# Patient Record
Sex: Male | Born: 1970
Health system: Southern US, Community
[De-identification: ages and names within clinical notes are randomized; demographics above are authoritative.]

## PROBLEM LIST (undated history)

## (undated) DIAGNOSIS — Z9119 Patient's noncompliance with other medical treatment and regimen: Secondary | ICD-10-CM

## (undated) DIAGNOSIS — N289 Disorder of kidney and ureter, unspecified: Secondary | ICD-10-CM

## (undated) DIAGNOSIS — I10 Essential (primary) hypertension: Secondary | ICD-10-CM

## (undated) DIAGNOSIS — Z91199 Patient's noncompliance with other medical treatment and regimen due to unspecified reason: Secondary | ICD-10-CM

## (undated) DIAGNOSIS — I639 Cerebral infarction, unspecified: Secondary | ICD-10-CM

## (undated) DIAGNOSIS — I5030 Unspecified diastolic (congestive) heart failure: Secondary | ICD-10-CM

## (undated) DIAGNOSIS — I4891 Unspecified atrial fibrillation: Secondary | ICD-10-CM

---

## 2015-06-28 DIAGNOSIS — R4689 Other symptoms and signs involving appearance and behavior: Secondary | ICD-10-CM | POA: Diagnosis not present

## 2015-06-28 DIAGNOSIS — Z992 Dependence on renal dialysis: Secondary | ICD-10-CM | POA: Diagnosis not present

## 2015-06-28 DIAGNOSIS — N186 End stage renal disease: Secondary | ICD-10-CM | POA: Diagnosis not present

## 2015-06-28 DIAGNOSIS — I10 Essential (primary) hypertension: Secondary | ICD-10-CM | POA: Diagnosis not present

## 2015-09-29 DIAGNOSIS — N185 Chronic kidney disease, stage 5: Secondary | ICD-10-CM | POA: Diagnosis not present

## 2017-02-07 DIAGNOSIS — N2581 Secondary hyperparathyroidism of renal origin: Secondary | ICD-10-CM | POA: Diagnosis present

## 2017-02-07 DIAGNOSIS — E872 Acidosis: Secondary | ICD-10-CM | POA: Diagnosis not present

## 2017-02-07 DIAGNOSIS — E875 Hyperkalemia: Secondary | ICD-10-CM | POA: Diagnosis present

## 2017-02-07 DIAGNOSIS — Z9115 Patient's noncompliance with renal dialysis: Secondary | ICD-10-CM | POA: Diagnosis not present

## 2017-02-07 DIAGNOSIS — G9341 Metabolic encephalopathy: Secondary | ICD-10-CM | POA: Diagnosis present

## 2017-02-07 DIAGNOSIS — R569 Unspecified convulsions: Secondary | ICD-10-CM | POA: Diagnosis present

## 2017-02-07 DIAGNOSIS — D631 Anemia in chronic kidney disease: Secondary | ICD-10-CM | POA: Diagnosis present

## 2017-02-07 DIAGNOSIS — I120 Hypertensive chronic kidney disease with stage 5 chronic kidney disease or end stage renal disease: Secondary | ICD-10-CM | POA: Diagnosis present

## 2017-02-07 DIAGNOSIS — I169 Hypertensive crisis, unspecified: Secondary | ICD-10-CM | POA: Diagnosis present

## 2017-02-07 DIAGNOSIS — R109 Unspecified abdominal pain: Secondary | ICD-10-CM | POA: Diagnosis not present

## 2017-02-07 DIAGNOSIS — N179 Acute kidney failure, unspecified: Secondary | ICD-10-CM | POA: Diagnosis present

## 2017-02-07 DIAGNOSIS — N186 End stage renal disease: Secondary | ICD-10-CM | POA: Diagnosis present

## 2017-02-14 DIAGNOSIS — Z114 Encounter for screening for human immunodeficiency virus [HIV]: Secondary | ICD-10-CM | POA: Diagnosis not present

## 2017-02-14 DIAGNOSIS — D509 Iron deficiency anemia, unspecified: Secondary | ICD-10-CM | POA: Diagnosis not present

## 2017-02-14 DIAGNOSIS — N186 End stage renal disease: Secondary | ICD-10-CM | POA: Diagnosis not present

## 2017-02-14 DIAGNOSIS — I10 Essential (primary) hypertension: Secondary | ICD-10-CM | POA: Diagnosis not present

## 2017-02-14 DIAGNOSIS — N2581 Secondary hyperparathyroidism of renal origin: Secondary | ICD-10-CM | POA: Diagnosis not present

## 2017-02-14 DIAGNOSIS — D631 Anemia in chronic kidney disease: Secondary | ICD-10-CM | POA: Diagnosis not present

## 2017-02-16 DIAGNOSIS — N186 End stage renal disease: Secondary | ICD-10-CM | POA: Diagnosis not present

## 2017-02-16 DIAGNOSIS — N2581 Secondary hyperparathyroidism of renal origin: Secondary | ICD-10-CM | POA: Diagnosis not present

## 2017-02-16 DIAGNOSIS — I10 Essential (primary) hypertension: Secondary | ICD-10-CM | POA: Diagnosis not present

## 2017-02-16 DIAGNOSIS — D631 Anemia in chronic kidney disease: Secondary | ICD-10-CM | POA: Diagnosis not present

## 2017-02-16 DIAGNOSIS — D509 Iron deficiency anemia, unspecified: Secondary | ICD-10-CM | POA: Diagnosis not present

## 2017-02-17 DIAGNOSIS — D631 Anemia in chronic kidney disease: Secondary | ICD-10-CM | POA: Diagnosis not present

## 2017-02-17 DIAGNOSIS — N189 Chronic kidney disease, unspecified: Secondary | ICD-10-CM | POA: Diagnosis not present

## 2017-02-21 DIAGNOSIS — I10 Essential (primary) hypertension: Secondary | ICD-10-CM | POA: Diagnosis not present

## 2017-02-21 DIAGNOSIS — N2581 Secondary hyperparathyroidism of renal origin: Secondary | ICD-10-CM | POA: Diagnosis not present

## 2017-02-21 DIAGNOSIS — D509 Iron deficiency anemia, unspecified: Secondary | ICD-10-CM | POA: Diagnosis not present

## 2017-02-21 DIAGNOSIS — N186 End stage renal disease: Secondary | ICD-10-CM | POA: Diagnosis not present

## 2017-02-21 DIAGNOSIS — D631 Anemia in chronic kidney disease: Secondary | ICD-10-CM | POA: Diagnosis not present

## 2017-02-23 DIAGNOSIS — I10 Essential (primary) hypertension: Secondary | ICD-10-CM | POA: Diagnosis not present

## 2017-02-23 DIAGNOSIS — N186 End stage renal disease: Secondary | ICD-10-CM | POA: Diagnosis not present

## 2017-02-23 DIAGNOSIS — N2581 Secondary hyperparathyroidism of renal origin: Secondary | ICD-10-CM | POA: Diagnosis not present

## 2017-02-23 DIAGNOSIS — D631 Anemia in chronic kidney disease: Secondary | ICD-10-CM | POA: Diagnosis not present

## 2017-02-25 DIAGNOSIS — I10 Essential (primary) hypertension: Secondary | ICD-10-CM | POA: Diagnosis not present

## 2017-02-25 DIAGNOSIS — D631 Anemia in chronic kidney disease: Secondary | ICD-10-CM | POA: Diagnosis not present

## 2017-02-25 DIAGNOSIS — N186 End stage renal disease: Secondary | ICD-10-CM | POA: Diagnosis not present

## 2017-02-25 DIAGNOSIS — D649 Anemia, unspecified: Secondary | ICD-10-CM | POA: Diagnosis not present

## 2017-02-25 DIAGNOSIS — N2581 Secondary hyperparathyroidism of renal origin: Secondary | ICD-10-CM | POA: Diagnosis not present

## 2017-02-28 DIAGNOSIS — N186 End stage renal disease: Secondary | ICD-10-CM | POA: Diagnosis not present

## 2017-02-28 DIAGNOSIS — I10 Essential (primary) hypertension: Secondary | ICD-10-CM | POA: Diagnosis not present

## 2017-02-28 DIAGNOSIS — D631 Anemia in chronic kidney disease: Secondary | ICD-10-CM | POA: Diagnosis not present

## 2017-02-28 DIAGNOSIS — D649 Anemia, unspecified: Secondary | ICD-10-CM | POA: Diagnosis not present

## 2017-02-28 DIAGNOSIS — N2581 Secondary hyperparathyroidism of renal origin: Secondary | ICD-10-CM | POA: Diagnosis not present

## 2017-03-04 DIAGNOSIS — I499 Cardiac arrhythmia, unspecified: Secondary | ICD-10-CM | POA: Diagnosis not present

## 2017-03-04 DIAGNOSIS — I10 Essential (primary) hypertension: Secondary | ICD-10-CM | POA: Diagnosis not present

## 2017-03-04 DIAGNOSIS — N2581 Secondary hyperparathyroidism of renal origin: Secondary | ICD-10-CM | POA: Diagnosis not present

## 2017-03-04 DIAGNOSIS — I517 Cardiomegaly: Secondary | ICD-10-CM | POA: Diagnosis not present

## 2017-03-04 DIAGNOSIS — N186 End stage renal disease: Secondary | ICD-10-CM | POA: Diagnosis not present

## 2017-03-04 DIAGNOSIS — R002 Palpitations: Secondary | ICD-10-CM | POA: Diagnosis not present

## 2017-03-04 DIAGNOSIS — D631 Anemia in chronic kidney disease: Secondary | ICD-10-CM | POA: Diagnosis not present

## 2017-03-07 DIAGNOSIS — I10 Essential (primary) hypertension: Secondary | ICD-10-CM | POA: Diagnosis not present

## 2017-03-07 DIAGNOSIS — N2581 Secondary hyperparathyroidism of renal origin: Secondary | ICD-10-CM | POA: Diagnosis not present

## 2017-03-07 DIAGNOSIS — N186 End stage renal disease: Secondary | ICD-10-CM | POA: Diagnosis not present

## 2017-03-07 DIAGNOSIS — D631 Anemia in chronic kidney disease: Secondary | ICD-10-CM | POA: Diagnosis not present

## 2017-03-09 DIAGNOSIS — D631 Anemia in chronic kidney disease: Secondary | ICD-10-CM | POA: Diagnosis not present

## 2017-03-09 DIAGNOSIS — I10 Essential (primary) hypertension: Secondary | ICD-10-CM | POA: Diagnosis not present

## 2017-03-09 DIAGNOSIS — N2581 Secondary hyperparathyroidism of renal origin: Secondary | ICD-10-CM | POA: Diagnosis not present

## 2017-03-09 DIAGNOSIS — N186 End stage renal disease: Secondary | ICD-10-CM | POA: Diagnosis not present

## 2017-03-11 DIAGNOSIS — N186 End stage renal disease: Secondary | ICD-10-CM | POA: Diagnosis not present

## 2017-03-11 DIAGNOSIS — N2581 Secondary hyperparathyroidism of renal origin: Secondary | ICD-10-CM | POA: Diagnosis not present

## 2017-03-11 DIAGNOSIS — D631 Anemia in chronic kidney disease: Secondary | ICD-10-CM | POA: Diagnosis not present

## 2017-03-11 DIAGNOSIS — I10 Essential (primary) hypertension: Secondary | ICD-10-CM | POA: Diagnosis not present

## 2017-03-14 DIAGNOSIS — N2581 Secondary hyperparathyroidism of renal origin: Secondary | ICD-10-CM | POA: Diagnosis not present

## 2017-03-14 DIAGNOSIS — N186 End stage renal disease: Secondary | ICD-10-CM | POA: Diagnosis not present

## 2017-03-14 DIAGNOSIS — I10 Essential (primary) hypertension: Secondary | ICD-10-CM | POA: Diagnosis not present

## 2017-03-14 DIAGNOSIS — D631 Anemia in chronic kidney disease: Secondary | ICD-10-CM | POA: Diagnosis not present

## 2017-03-17 DIAGNOSIS — B9689 Other specified bacterial agents as the cause of diseases classified elsewhere: Secondary | ICD-10-CM | POA: Diagnosis not present

## 2017-03-17 DIAGNOSIS — Z992 Dependence on renal dialysis: Secondary | ICD-10-CM | POA: Diagnosis not present

## 2017-03-17 DIAGNOSIS — E875 Hyperkalemia: Secondary | ICD-10-CM | POA: Diagnosis not present

## 2017-03-17 DIAGNOSIS — R0602 Shortness of breath: Secondary | ICD-10-CM | POA: Diagnosis not present

## 2017-03-17 DIAGNOSIS — I160 Hypertensive urgency: Secondary | ICD-10-CM | POA: Diagnosis not present

## 2017-03-17 DIAGNOSIS — E889 Metabolic disorder, unspecified: Secondary | ICD-10-CM | POA: Diagnosis not present

## 2017-03-17 DIAGNOSIS — J181 Lobar pneumonia, unspecified organism: Secondary | ICD-10-CM | POA: Diagnosis not present

## 2017-03-17 DIAGNOSIS — D631 Anemia in chronic kidney disease: Secondary | ICD-10-CM | POA: Diagnosis not present

## 2017-03-17 DIAGNOSIS — Z9115 Patient's noncompliance with renal dialysis: Secondary | ICD-10-CM | POA: Diagnosis not present

## 2017-03-17 DIAGNOSIS — Z833 Family history of diabetes mellitus: Secondary | ICD-10-CM | POA: Diagnosis not present

## 2017-03-17 DIAGNOSIS — I120 Hypertensive chronic kidney disease with stage 5 chronic kidney disease or end stage renal disease: Secondary | ICD-10-CM | POA: Diagnosis not present

## 2017-03-17 DIAGNOSIS — I4891 Unspecified atrial fibrillation: Secondary | ICD-10-CM | POA: Diagnosis not present

## 2017-03-17 DIAGNOSIS — R7989 Other specified abnormal findings of blood chemistry: Secondary | ICD-10-CM | POA: Diagnosis not present

## 2017-03-17 DIAGNOSIS — M9080 Osteopathy in diseases classified elsewhere, unspecified site: Secondary | ICD-10-CM | POA: Diagnosis not present

## 2017-03-17 DIAGNOSIS — N186 End stage renal disease: Secondary | ICD-10-CM | POA: Diagnosis not present

## 2017-03-17 DIAGNOSIS — Z9119 Patient's noncompliance with other medical treatment and regimen: Secondary | ICD-10-CM | POA: Diagnosis not present

## 2017-03-17 DIAGNOSIS — R000 Tachycardia, unspecified: Secondary | ICD-10-CM | POA: Diagnosis not present

## 2017-03-17 DIAGNOSIS — J189 Pneumonia, unspecified organism: Secondary | ICD-10-CM | POA: Diagnosis not present

## 2017-03-17 DIAGNOSIS — E8770 Fluid overload, unspecified: Secondary | ICD-10-CM | POA: Diagnosis not present

## 2017-03-21 DIAGNOSIS — D631 Anemia in chronic kidney disease: Secondary | ICD-10-CM | POA: Diagnosis not present

## 2017-03-21 DIAGNOSIS — I10 Essential (primary) hypertension: Secondary | ICD-10-CM | POA: Diagnosis not present

## 2017-03-21 DIAGNOSIS — N2581 Secondary hyperparathyroidism of renal origin: Secondary | ICD-10-CM | POA: Diagnosis not present

## 2017-03-21 DIAGNOSIS — N186 End stage renal disease: Secondary | ICD-10-CM | POA: Diagnosis not present

## 2017-03-24 DIAGNOSIS — N186 End stage renal disease: Secondary | ICD-10-CM | POA: Diagnosis not present

## 2017-03-24 DIAGNOSIS — Z992 Dependence on renal dialysis: Secondary | ICD-10-CM | POA: Diagnosis not present

## 2017-03-25 DIAGNOSIS — D631 Anemia in chronic kidney disease: Secondary | ICD-10-CM | POA: Diagnosis not present

## 2017-03-25 DIAGNOSIS — N2581 Secondary hyperparathyroidism of renal origin: Secondary | ICD-10-CM | POA: Diagnosis not present

## 2017-03-25 DIAGNOSIS — D509 Iron deficiency anemia, unspecified: Secondary | ICD-10-CM | POA: Diagnosis not present

## 2017-03-25 DIAGNOSIS — N186 End stage renal disease: Secondary | ICD-10-CM | POA: Diagnosis not present

## 2017-03-25 DIAGNOSIS — Z23 Encounter for immunization: Secondary | ICD-10-CM | POA: Diagnosis not present

## 2017-03-25 DIAGNOSIS — I10 Essential (primary) hypertension: Secondary | ICD-10-CM | POA: Diagnosis not present

## 2017-03-28 DIAGNOSIS — N2581 Secondary hyperparathyroidism of renal origin: Secondary | ICD-10-CM | POA: Diagnosis not present

## 2017-03-28 DIAGNOSIS — I10 Essential (primary) hypertension: Secondary | ICD-10-CM | POA: Diagnosis not present

## 2017-03-28 DIAGNOSIS — D509 Iron deficiency anemia, unspecified: Secondary | ICD-10-CM | POA: Diagnosis not present

## 2017-03-28 DIAGNOSIS — D631 Anemia in chronic kidney disease: Secondary | ICD-10-CM | POA: Diagnosis not present

## 2017-03-28 DIAGNOSIS — N186 End stage renal disease: Secondary | ICD-10-CM | POA: Diagnosis not present

## 2017-03-28 DIAGNOSIS — Z23 Encounter for immunization: Secondary | ICD-10-CM | POA: Diagnosis not present

## 2017-03-30 DIAGNOSIS — D509 Iron deficiency anemia, unspecified: Secondary | ICD-10-CM | POA: Diagnosis not present

## 2017-03-30 DIAGNOSIS — N186 End stage renal disease: Secondary | ICD-10-CM | POA: Diagnosis not present

## 2017-03-30 DIAGNOSIS — I10 Essential (primary) hypertension: Secondary | ICD-10-CM | POA: Diagnosis not present

## 2017-03-30 DIAGNOSIS — D631 Anemia in chronic kidney disease: Secondary | ICD-10-CM | POA: Diagnosis not present

## 2017-03-30 DIAGNOSIS — N2581 Secondary hyperparathyroidism of renal origin: Secondary | ICD-10-CM | POA: Diagnosis not present

## 2017-03-30 DIAGNOSIS — Z23 Encounter for immunization: Secondary | ICD-10-CM | POA: Diagnosis not present

## 2017-04-01 DIAGNOSIS — D631 Anemia in chronic kidney disease: Secondary | ICD-10-CM | POA: Diagnosis not present

## 2017-04-01 DIAGNOSIS — N186 End stage renal disease: Secondary | ICD-10-CM | POA: Diagnosis not present

## 2017-04-01 DIAGNOSIS — N2581 Secondary hyperparathyroidism of renal origin: Secondary | ICD-10-CM | POA: Diagnosis not present

## 2017-04-01 DIAGNOSIS — I10 Essential (primary) hypertension: Secondary | ICD-10-CM | POA: Diagnosis not present

## 2017-04-01 DIAGNOSIS — Z23 Encounter for immunization: Secondary | ICD-10-CM | POA: Diagnosis not present

## 2017-04-01 DIAGNOSIS — D509 Iron deficiency anemia, unspecified: Secondary | ICD-10-CM | POA: Diagnosis not present

## 2017-04-04 DIAGNOSIS — D631 Anemia in chronic kidney disease: Secondary | ICD-10-CM | POA: Diagnosis not present

## 2017-04-04 DIAGNOSIS — I10 Essential (primary) hypertension: Secondary | ICD-10-CM | POA: Diagnosis not present

## 2017-04-04 DIAGNOSIS — N2581 Secondary hyperparathyroidism of renal origin: Secondary | ICD-10-CM | POA: Diagnosis not present

## 2017-04-04 DIAGNOSIS — Z23 Encounter for immunization: Secondary | ICD-10-CM | POA: Diagnosis not present

## 2017-04-04 DIAGNOSIS — D509 Iron deficiency anemia, unspecified: Secondary | ICD-10-CM | POA: Diagnosis not present

## 2017-04-04 DIAGNOSIS — N186 End stage renal disease: Secondary | ICD-10-CM | POA: Diagnosis not present

## 2017-04-06 DIAGNOSIS — I10 Essential (primary) hypertension: Secondary | ICD-10-CM | POA: Diagnosis not present

## 2017-04-06 DIAGNOSIS — Z23 Encounter for immunization: Secondary | ICD-10-CM | POA: Diagnosis not present

## 2017-04-06 DIAGNOSIS — N186 End stage renal disease: Secondary | ICD-10-CM | POA: Diagnosis not present

## 2017-04-06 DIAGNOSIS — D631 Anemia in chronic kidney disease: Secondary | ICD-10-CM | POA: Diagnosis not present

## 2017-04-06 DIAGNOSIS — N2581 Secondary hyperparathyroidism of renal origin: Secondary | ICD-10-CM | POA: Diagnosis not present

## 2017-04-06 DIAGNOSIS — D509 Iron deficiency anemia, unspecified: Secondary | ICD-10-CM | POA: Diagnosis not present

## 2017-04-08 DIAGNOSIS — N186 End stage renal disease: Secondary | ICD-10-CM | POA: Diagnosis not present

## 2017-04-08 DIAGNOSIS — N2581 Secondary hyperparathyroidism of renal origin: Secondary | ICD-10-CM | POA: Diagnosis not present

## 2017-04-08 DIAGNOSIS — D631 Anemia in chronic kidney disease: Secondary | ICD-10-CM | POA: Diagnosis not present

## 2017-04-08 DIAGNOSIS — D509 Iron deficiency anemia, unspecified: Secondary | ICD-10-CM | POA: Diagnosis not present

## 2017-04-08 DIAGNOSIS — Z23 Encounter for immunization: Secondary | ICD-10-CM | POA: Diagnosis not present

## 2017-04-08 DIAGNOSIS — I10 Essential (primary) hypertension: Secondary | ICD-10-CM | POA: Diagnosis not present

## 2017-04-11 DIAGNOSIS — N186 End stage renal disease: Secondary | ICD-10-CM | POA: Diagnosis not present

## 2017-04-11 DIAGNOSIS — D509 Iron deficiency anemia, unspecified: Secondary | ICD-10-CM | POA: Diagnosis not present

## 2017-04-11 DIAGNOSIS — I10 Essential (primary) hypertension: Secondary | ICD-10-CM | POA: Diagnosis not present

## 2017-04-11 DIAGNOSIS — D631 Anemia in chronic kidney disease: Secondary | ICD-10-CM | POA: Diagnosis not present

## 2017-04-11 DIAGNOSIS — Z23 Encounter for immunization: Secondary | ICD-10-CM | POA: Diagnosis not present

## 2017-04-11 DIAGNOSIS — N2581 Secondary hyperparathyroidism of renal origin: Secondary | ICD-10-CM | POA: Diagnosis not present

## 2017-04-13 DIAGNOSIS — I10 Essential (primary) hypertension: Secondary | ICD-10-CM | POA: Diagnosis not present

## 2017-04-13 DIAGNOSIS — D509 Iron deficiency anemia, unspecified: Secondary | ICD-10-CM | POA: Diagnosis not present

## 2017-04-13 DIAGNOSIS — D631 Anemia in chronic kidney disease: Secondary | ICD-10-CM | POA: Diagnosis not present

## 2017-04-13 DIAGNOSIS — N2581 Secondary hyperparathyroidism of renal origin: Secondary | ICD-10-CM | POA: Diagnosis not present

## 2017-04-13 DIAGNOSIS — Z23 Encounter for immunization: Secondary | ICD-10-CM | POA: Diagnosis not present

## 2017-04-13 DIAGNOSIS — N186 End stage renal disease: Secondary | ICD-10-CM | POA: Diagnosis not present

## 2017-04-15 DIAGNOSIS — Z23 Encounter for immunization: Secondary | ICD-10-CM | POA: Diagnosis not present

## 2017-04-15 DIAGNOSIS — D631 Anemia in chronic kidney disease: Secondary | ICD-10-CM | POA: Diagnosis not present

## 2017-04-15 DIAGNOSIS — D509 Iron deficiency anemia, unspecified: Secondary | ICD-10-CM | POA: Diagnosis not present

## 2017-04-15 DIAGNOSIS — N186 End stage renal disease: Secondary | ICD-10-CM | POA: Diagnosis not present

## 2017-04-15 DIAGNOSIS — I10 Essential (primary) hypertension: Secondary | ICD-10-CM | POA: Diagnosis not present

## 2017-04-15 DIAGNOSIS — N2581 Secondary hyperparathyroidism of renal origin: Secondary | ICD-10-CM | POA: Diagnosis not present

## 2017-04-18 DIAGNOSIS — N2581 Secondary hyperparathyroidism of renal origin: Secondary | ICD-10-CM | POA: Diagnosis not present

## 2017-04-18 DIAGNOSIS — Z23 Encounter for immunization: Secondary | ICD-10-CM | POA: Diagnosis not present

## 2017-04-18 DIAGNOSIS — D509 Iron deficiency anemia, unspecified: Secondary | ICD-10-CM | POA: Diagnosis not present

## 2017-04-18 DIAGNOSIS — D631 Anemia in chronic kidney disease: Secondary | ICD-10-CM | POA: Diagnosis not present

## 2017-04-18 DIAGNOSIS — I10 Essential (primary) hypertension: Secondary | ICD-10-CM | POA: Diagnosis not present

## 2017-04-18 DIAGNOSIS — N186 End stage renal disease: Secondary | ICD-10-CM | POA: Diagnosis not present

## 2017-04-21 DIAGNOSIS — Z992 Dependence on renal dialysis: Secondary | ICD-10-CM | POA: Diagnosis not present

## 2017-04-21 DIAGNOSIS — N186 End stage renal disease: Secondary | ICD-10-CM | POA: Diagnosis not present

## 2017-04-22 DIAGNOSIS — N2581 Secondary hyperparathyroidism of renal origin: Secondary | ICD-10-CM | POA: Diagnosis not present

## 2017-04-22 DIAGNOSIS — N186 End stage renal disease: Secondary | ICD-10-CM | POA: Diagnosis not present

## 2017-04-22 DIAGNOSIS — D631 Anemia in chronic kidney disease: Secondary | ICD-10-CM | POA: Diagnosis not present

## 2017-04-22 DIAGNOSIS — D509 Iron deficiency anemia, unspecified: Secondary | ICD-10-CM | POA: Diagnosis not present

## 2017-04-22 DIAGNOSIS — I10 Essential (primary) hypertension: Secondary | ICD-10-CM | POA: Diagnosis not present

## 2017-04-25 DIAGNOSIS — N2581 Secondary hyperparathyroidism of renal origin: Secondary | ICD-10-CM | POA: Diagnosis not present

## 2017-04-25 DIAGNOSIS — D509 Iron deficiency anemia, unspecified: Secondary | ICD-10-CM | POA: Diagnosis not present

## 2017-04-25 DIAGNOSIS — D631 Anemia in chronic kidney disease: Secondary | ICD-10-CM | POA: Diagnosis not present

## 2017-04-25 DIAGNOSIS — I10 Essential (primary) hypertension: Secondary | ICD-10-CM | POA: Diagnosis not present

## 2017-04-25 DIAGNOSIS — N186 End stage renal disease: Secondary | ICD-10-CM | POA: Diagnosis not present

## 2017-04-27 DIAGNOSIS — N186 End stage renal disease: Secondary | ICD-10-CM | POA: Diagnosis not present

## 2017-04-27 DIAGNOSIS — D631 Anemia in chronic kidney disease: Secondary | ICD-10-CM | POA: Diagnosis not present

## 2017-04-27 DIAGNOSIS — N2581 Secondary hyperparathyroidism of renal origin: Secondary | ICD-10-CM | POA: Diagnosis not present

## 2017-04-27 DIAGNOSIS — D509 Iron deficiency anemia, unspecified: Secondary | ICD-10-CM | POA: Diagnosis not present

## 2017-04-27 DIAGNOSIS — I10 Essential (primary) hypertension: Secondary | ICD-10-CM | POA: Diagnosis not present

## 2017-04-29 DIAGNOSIS — N186 End stage renal disease: Secondary | ICD-10-CM | POA: Diagnosis not present

## 2017-04-29 DIAGNOSIS — D631 Anemia in chronic kidney disease: Secondary | ICD-10-CM | POA: Diagnosis not present

## 2017-04-29 DIAGNOSIS — N2581 Secondary hyperparathyroidism of renal origin: Secondary | ICD-10-CM | POA: Diagnosis not present

## 2017-04-29 DIAGNOSIS — I10 Essential (primary) hypertension: Secondary | ICD-10-CM | POA: Diagnosis not present

## 2017-04-29 DIAGNOSIS — D509 Iron deficiency anemia, unspecified: Secondary | ICD-10-CM | POA: Diagnosis not present

## 2017-05-02 DIAGNOSIS — R0602 Shortness of breath: Secondary | ICD-10-CM | POA: Diagnosis not present

## 2017-05-02 DIAGNOSIS — I509 Heart failure, unspecified: Secondary | ICD-10-CM | POA: Diagnosis not present

## 2017-05-02 DIAGNOSIS — D509 Iron deficiency anemia, unspecified: Secondary | ICD-10-CM | POA: Diagnosis not present

## 2017-05-02 DIAGNOSIS — I10 Essential (primary) hypertension: Secondary | ICD-10-CM | POA: Diagnosis not present

## 2017-05-02 DIAGNOSIS — N186 End stage renal disease: Secondary | ICD-10-CM | POA: Diagnosis not present

## 2017-05-02 DIAGNOSIS — N189 Chronic kidney disease, unspecified: Secondary | ICD-10-CM | POA: Diagnosis not present

## 2017-05-02 DIAGNOSIS — I130 Hypertensive heart and chronic kidney disease with heart failure and stage 1 through stage 4 chronic kidney disease, or unspecified chronic kidney disease: Secondary | ICD-10-CM | POA: Diagnosis not present

## 2017-05-02 DIAGNOSIS — N2581 Secondary hyperparathyroidism of renal origin: Secondary | ICD-10-CM | POA: Diagnosis not present

## 2017-05-02 DIAGNOSIS — D631 Anemia in chronic kidney disease: Secondary | ICD-10-CM | POA: Diagnosis not present

## 2017-05-02 DIAGNOSIS — R002 Palpitations: Secondary | ICD-10-CM | POA: Diagnosis not present

## 2017-05-02 DIAGNOSIS — I4891 Unspecified atrial fibrillation: Secondary | ICD-10-CM | POA: Diagnosis not present

## 2017-05-06 DIAGNOSIS — D509 Iron deficiency anemia, unspecified: Secondary | ICD-10-CM | POA: Diagnosis not present

## 2017-05-06 DIAGNOSIS — N186 End stage renal disease: Secondary | ICD-10-CM | POA: Diagnosis not present

## 2017-05-06 DIAGNOSIS — N2581 Secondary hyperparathyroidism of renal origin: Secondary | ICD-10-CM | POA: Diagnosis not present

## 2017-05-06 DIAGNOSIS — I10 Essential (primary) hypertension: Secondary | ICD-10-CM | POA: Diagnosis not present

## 2017-05-06 DIAGNOSIS — D631 Anemia in chronic kidney disease: Secondary | ICD-10-CM | POA: Diagnosis not present

## 2017-05-09 DIAGNOSIS — N186 End stage renal disease: Secondary | ICD-10-CM | POA: Diagnosis not present

## 2017-05-09 DIAGNOSIS — I10 Essential (primary) hypertension: Secondary | ICD-10-CM | POA: Diagnosis not present

## 2017-05-09 DIAGNOSIS — D631 Anemia in chronic kidney disease: Secondary | ICD-10-CM | POA: Diagnosis not present

## 2017-05-09 DIAGNOSIS — D509 Iron deficiency anemia, unspecified: Secondary | ICD-10-CM | POA: Diagnosis not present

## 2017-05-09 DIAGNOSIS — N2581 Secondary hyperparathyroidism of renal origin: Secondary | ICD-10-CM | POA: Diagnosis not present

## 2017-05-13 DIAGNOSIS — D631 Anemia in chronic kidney disease: Secondary | ICD-10-CM | POA: Diagnosis not present

## 2017-05-13 DIAGNOSIS — N186 End stage renal disease: Secondary | ICD-10-CM | POA: Diagnosis not present

## 2017-05-13 DIAGNOSIS — N2581 Secondary hyperparathyroidism of renal origin: Secondary | ICD-10-CM | POA: Diagnosis not present

## 2017-05-13 DIAGNOSIS — D509 Iron deficiency anemia, unspecified: Secondary | ICD-10-CM | POA: Diagnosis not present

## 2017-05-13 DIAGNOSIS — I10 Essential (primary) hypertension: Secondary | ICD-10-CM | POA: Diagnosis not present

## 2017-05-16 DIAGNOSIS — D509 Iron deficiency anemia, unspecified: Secondary | ICD-10-CM | POA: Diagnosis not present

## 2017-05-16 DIAGNOSIS — D631 Anemia in chronic kidney disease: Secondary | ICD-10-CM | POA: Diagnosis not present

## 2017-05-16 DIAGNOSIS — I10 Essential (primary) hypertension: Secondary | ICD-10-CM | POA: Diagnosis not present

## 2017-05-16 DIAGNOSIS — N2581 Secondary hyperparathyroidism of renal origin: Secondary | ICD-10-CM | POA: Diagnosis not present

## 2017-05-16 DIAGNOSIS — N186 End stage renal disease: Secondary | ICD-10-CM | POA: Diagnosis not present

## 2017-05-18 DIAGNOSIS — I10 Essential (primary) hypertension: Secondary | ICD-10-CM | POA: Diagnosis not present

## 2017-05-18 DIAGNOSIS — D509 Iron deficiency anemia, unspecified: Secondary | ICD-10-CM | POA: Diagnosis not present

## 2017-05-18 DIAGNOSIS — D631 Anemia in chronic kidney disease: Secondary | ICD-10-CM | POA: Diagnosis not present

## 2017-05-18 DIAGNOSIS — N186 End stage renal disease: Secondary | ICD-10-CM | POA: Diagnosis not present

## 2017-05-18 DIAGNOSIS — N2581 Secondary hyperparathyroidism of renal origin: Secondary | ICD-10-CM | POA: Diagnosis not present

## 2017-05-20 DIAGNOSIS — D631 Anemia in chronic kidney disease: Secondary | ICD-10-CM | POA: Diagnosis not present

## 2017-05-20 DIAGNOSIS — N186 End stage renal disease: Secondary | ICD-10-CM | POA: Diagnosis not present

## 2017-05-20 DIAGNOSIS — I10 Essential (primary) hypertension: Secondary | ICD-10-CM | POA: Diagnosis not present

## 2017-05-20 DIAGNOSIS — N2581 Secondary hyperparathyroidism of renal origin: Secondary | ICD-10-CM | POA: Diagnosis not present

## 2017-05-20 DIAGNOSIS — D509 Iron deficiency anemia, unspecified: Secondary | ICD-10-CM | POA: Diagnosis not present

## 2017-05-22 DIAGNOSIS — N186 End stage renal disease: Secondary | ICD-10-CM | POA: Diagnosis not present

## 2017-05-22 DIAGNOSIS — Z992 Dependence on renal dialysis: Secondary | ICD-10-CM | POA: Diagnosis not present

## 2017-05-23 DIAGNOSIS — N2581 Secondary hyperparathyroidism of renal origin: Secondary | ICD-10-CM | POA: Diagnosis not present

## 2017-05-23 DIAGNOSIS — N186 End stage renal disease: Secondary | ICD-10-CM | POA: Diagnosis not present

## 2017-05-25 DIAGNOSIS — N186 End stage renal disease: Secondary | ICD-10-CM | POA: Diagnosis not present

## 2017-05-25 DIAGNOSIS — N2581 Secondary hyperparathyroidism of renal origin: Secondary | ICD-10-CM | POA: Diagnosis not present

## 2017-05-27 DIAGNOSIS — D509 Iron deficiency anemia, unspecified: Secondary | ICD-10-CM | POA: Diagnosis not present

## 2017-05-27 DIAGNOSIS — I10 Essential (primary) hypertension: Secondary | ICD-10-CM | POA: Diagnosis not present

## 2017-05-27 DIAGNOSIS — N2581 Secondary hyperparathyroidism of renal origin: Secondary | ICD-10-CM | POA: Diagnosis not present

## 2017-05-27 DIAGNOSIS — N186 End stage renal disease: Secondary | ICD-10-CM | POA: Diagnosis not present

## 2017-05-27 DIAGNOSIS — D631 Anemia in chronic kidney disease: Secondary | ICD-10-CM | POA: Diagnosis not present

## 2017-05-30 DIAGNOSIS — D509 Iron deficiency anemia, unspecified: Secondary | ICD-10-CM | POA: Diagnosis not present

## 2017-05-30 DIAGNOSIS — N186 End stage renal disease: Secondary | ICD-10-CM | POA: Diagnosis not present

## 2017-05-30 DIAGNOSIS — N2581 Secondary hyperparathyroidism of renal origin: Secondary | ICD-10-CM | POA: Diagnosis not present

## 2017-05-30 DIAGNOSIS — D631 Anemia in chronic kidney disease: Secondary | ICD-10-CM | POA: Diagnosis not present

## 2017-05-30 DIAGNOSIS — I10 Essential (primary) hypertension: Secondary | ICD-10-CM | POA: Diagnosis not present

## 2017-06-01 DIAGNOSIS — N186 End stage renal disease: Secondary | ICD-10-CM | POA: Diagnosis not present

## 2017-06-01 DIAGNOSIS — I10 Essential (primary) hypertension: Secondary | ICD-10-CM | POA: Diagnosis not present

## 2017-06-01 DIAGNOSIS — D631 Anemia in chronic kidney disease: Secondary | ICD-10-CM | POA: Diagnosis not present

## 2017-06-01 DIAGNOSIS — D509 Iron deficiency anemia, unspecified: Secondary | ICD-10-CM | POA: Diagnosis not present

## 2017-06-01 DIAGNOSIS — N2581 Secondary hyperparathyroidism of renal origin: Secondary | ICD-10-CM | POA: Diagnosis not present

## 2017-06-03 DIAGNOSIS — D509 Iron deficiency anemia, unspecified: Secondary | ICD-10-CM | POA: Diagnosis not present

## 2017-06-03 DIAGNOSIS — I10 Essential (primary) hypertension: Secondary | ICD-10-CM | POA: Diagnosis not present

## 2017-06-03 DIAGNOSIS — N2581 Secondary hyperparathyroidism of renal origin: Secondary | ICD-10-CM | POA: Diagnosis not present

## 2017-06-03 DIAGNOSIS — N186 End stage renal disease: Secondary | ICD-10-CM | POA: Diagnosis not present

## 2017-06-03 DIAGNOSIS — D631 Anemia in chronic kidney disease: Secondary | ICD-10-CM | POA: Diagnosis not present

## 2017-06-05 DIAGNOSIS — Z8249 Family history of ischemic heart disease and other diseases of the circulatory system: Secondary | ICD-10-CM | POA: Diagnosis not present

## 2017-06-05 DIAGNOSIS — I120 Hypertensive chronic kidney disease with stage 5 chronic kidney disease or end stage renal disease: Secondary | ICD-10-CM | POA: Diagnosis not present

## 2017-06-05 DIAGNOSIS — R0789 Other chest pain: Secondary | ICD-10-CM | POA: Diagnosis not present

## 2017-06-05 DIAGNOSIS — Z833 Family history of diabetes mellitus: Secondary | ICD-10-CM | POA: Diagnosis not present

## 2017-06-05 DIAGNOSIS — R0602 Shortness of breath: Secondary | ICD-10-CM | POA: Diagnosis not present

## 2017-06-05 DIAGNOSIS — Z79899 Other long term (current) drug therapy: Secondary | ICD-10-CM | POA: Diagnosis not present

## 2017-06-05 DIAGNOSIS — I4891 Unspecified atrial fibrillation: Secondary | ICD-10-CM | POA: Diagnosis not present

## 2017-06-05 DIAGNOSIS — J9620 Acute and chronic respiratory failure, unspecified whether with hypoxia or hypercapnia: Secondary | ICD-10-CM | POA: Diagnosis not present

## 2017-06-05 DIAGNOSIS — Z9981 Dependence on supplemental oxygen: Secondary | ICD-10-CM | POA: Diagnosis not present

## 2017-06-05 DIAGNOSIS — N186 End stage renal disease: Secondary | ICD-10-CM | POA: Diagnosis not present

## 2017-06-05 DIAGNOSIS — I5030 Unspecified diastolic (congestive) heart failure: Secondary | ICD-10-CM | POA: Diagnosis not present

## 2017-06-05 DIAGNOSIS — J811 Chronic pulmonary edema: Secondary | ICD-10-CM | POA: Diagnosis not present

## 2017-06-05 DIAGNOSIS — I161 Hypertensive emergency: Secondary | ICD-10-CM | POA: Diagnosis not present

## 2017-06-05 DIAGNOSIS — D631 Anemia in chronic kidney disease: Secondary | ICD-10-CM | POA: Diagnosis not present

## 2017-06-05 DIAGNOSIS — I132 Hypertensive heart and chronic kidney disease with heart failure and with stage 5 chronic kidney disease, or end stage renal disease: Secondary | ICD-10-CM | POA: Diagnosis not present

## 2017-06-05 DIAGNOSIS — J9621 Acute and chronic respiratory failure with hypoxia: Secondary | ICD-10-CM | POA: Diagnosis not present

## 2017-06-05 DIAGNOSIS — E875 Hyperkalemia: Secondary | ICD-10-CM | POA: Diagnosis not present

## 2017-06-05 DIAGNOSIS — Z9114 Patient's other noncompliance with medication regimen: Secondary | ICD-10-CM | POA: Diagnosis not present

## 2017-06-05 DIAGNOSIS — Z992 Dependence on renal dialysis: Secondary | ICD-10-CM | POA: Diagnosis not present

## 2017-06-05 DIAGNOSIS — I517 Cardiomegaly: Secondary | ICD-10-CM | POA: Diagnosis not present

## 2017-06-05 DIAGNOSIS — E8770 Fluid overload, unspecified: Secondary | ICD-10-CM | POA: Diagnosis not present

## 2017-06-05 DIAGNOSIS — I1311 Hypertensive heart and chronic kidney disease without heart failure, with stage 5 chronic kidney disease, or end stage renal disease: Secondary | ICD-10-CM | POA: Diagnosis not present

## 2017-06-05 DIAGNOSIS — J9600 Acute respiratory failure, unspecified whether with hypoxia or hypercapnia: Secondary | ICD-10-CM | POA: Diagnosis not present

## 2017-06-05 DIAGNOSIS — I160 Hypertensive urgency: Secondary | ICD-10-CM | POA: Diagnosis not present

## 2017-06-05 DIAGNOSIS — I5033 Acute on chronic diastolic (congestive) heart failure: Secondary | ICD-10-CM | POA: Diagnosis not present

## 2017-06-05 DIAGNOSIS — J918 Pleural effusion in other conditions classified elsewhere: Secondary | ICD-10-CM | POA: Diagnosis not present

## 2017-06-05 DIAGNOSIS — Z7901 Long term (current) use of anticoagulants: Secondary | ICD-10-CM | POA: Diagnosis not present

## 2017-06-05 DIAGNOSIS — R000 Tachycardia, unspecified: Secondary | ICD-10-CM | POA: Diagnosis not present

## 2017-06-05 DIAGNOSIS — I480 Paroxysmal atrial fibrillation: Secondary | ICD-10-CM | POA: Diagnosis not present

## 2017-06-05 DIAGNOSIS — J810 Acute pulmonary edema: Secondary | ICD-10-CM | POA: Diagnosis not present

## 2017-06-05 DIAGNOSIS — I482 Chronic atrial fibrillation: Secondary | ICD-10-CM | POA: Diagnosis not present

## 2017-06-10 DIAGNOSIS — D509 Iron deficiency anemia, unspecified: Secondary | ICD-10-CM | POA: Diagnosis not present

## 2017-06-10 DIAGNOSIS — N186 End stage renal disease: Secondary | ICD-10-CM | POA: Diagnosis not present

## 2017-06-10 DIAGNOSIS — I10 Essential (primary) hypertension: Secondary | ICD-10-CM | POA: Diagnosis not present

## 2017-06-10 DIAGNOSIS — D631 Anemia in chronic kidney disease: Secondary | ICD-10-CM | POA: Diagnosis not present

## 2017-06-10 DIAGNOSIS — N2581 Secondary hyperparathyroidism of renal origin: Secondary | ICD-10-CM | POA: Diagnosis not present

## 2017-06-13 DIAGNOSIS — N186 End stage renal disease: Secondary | ICD-10-CM | POA: Diagnosis not present

## 2017-06-13 DIAGNOSIS — I10 Essential (primary) hypertension: Secondary | ICD-10-CM | POA: Diagnosis not present

## 2017-06-13 DIAGNOSIS — N2581 Secondary hyperparathyroidism of renal origin: Secondary | ICD-10-CM | POA: Diagnosis not present

## 2017-06-13 DIAGNOSIS — D631 Anemia in chronic kidney disease: Secondary | ICD-10-CM | POA: Diagnosis not present

## 2017-06-13 DIAGNOSIS — D509 Iron deficiency anemia, unspecified: Secondary | ICD-10-CM | POA: Diagnosis not present

## 2017-06-15 DIAGNOSIS — D509 Iron deficiency anemia, unspecified: Secondary | ICD-10-CM | POA: Diagnosis not present

## 2017-06-15 DIAGNOSIS — I10 Essential (primary) hypertension: Secondary | ICD-10-CM | POA: Diagnosis not present

## 2017-06-15 DIAGNOSIS — N2581 Secondary hyperparathyroidism of renal origin: Secondary | ICD-10-CM | POA: Diagnosis not present

## 2017-06-15 DIAGNOSIS — N186 End stage renal disease: Secondary | ICD-10-CM | POA: Diagnosis not present

## 2017-06-15 DIAGNOSIS — D631 Anemia in chronic kidney disease: Secondary | ICD-10-CM | POA: Diagnosis not present

## 2017-06-17 DIAGNOSIS — D631 Anemia in chronic kidney disease: Secondary | ICD-10-CM | POA: Diagnosis not present

## 2017-06-17 DIAGNOSIS — D509 Iron deficiency anemia, unspecified: Secondary | ICD-10-CM | POA: Diagnosis not present

## 2017-06-17 DIAGNOSIS — I10 Essential (primary) hypertension: Secondary | ICD-10-CM | POA: Diagnosis not present

## 2017-06-17 DIAGNOSIS — N2581 Secondary hyperparathyroidism of renal origin: Secondary | ICD-10-CM | POA: Diagnosis not present

## 2017-06-17 DIAGNOSIS — N186 End stage renal disease: Secondary | ICD-10-CM | POA: Diagnosis not present

## 2017-06-19 DIAGNOSIS — N186 End stage renal disease: Secondary | ICD-10-CM | POA: Diagnosis not present

## 2017-06-19 DIAGNOSIS — Z992 Dependence on renal dialysis: Secondary | ICD-10-CM | POA: Diagnosis not present

## 2017-06-20 DIAGNOSIS — N186 End stage renal disease: Secondary | ICD-10-CM | POA: Diagnosis not present

## 2017-06-20 DIAGNOSIS — I5033 Acute on chronic diastolic (congestive) heart failure: Secondary | ICD-10-CM | POA: Diagnosis not present

## 2017-06-20 DIAGNOSIS — Z992 Dependence on renal dialysis: Secondary | ICD-10-CM | POA: Diagnosis not present

## 2017-06-20 DIAGNOSIS — I429 Cardiomyopathy, unspecified: Secondary | ICD-10-CM | POA: Diagnosis not present

## 2017-06-20 DIAGNOSIS — Z7901 Long term (current) use of anticoagulants: Secondary | ICD-10-CM | POA: Diagnosis not present

## 2017-06-20 DIAGNOSIS — I471 Supraventricular tachycardia: Secondary | ICD-10-CM | POA: Diagnosis not present

## 2017-06-20 DIAGNOSIS — Z8673 Personal history of transient ischemic attack (TIA), and cerebral infarction without residual deficits: Secondary | ICD-10-CM | POA: Diagnosis not present

## 2017-06-20 DIAGNOSIS — E8770 Fluid overload, unspecified: Secondary | ICD-10-CM | POA: Diagnosis not present

## 2017-06-20 DIAGNOSIS — D631 Anemia in chronic kidney disease: Secondary | ICD-10-CM | POA: Diagnosis not present

## 2017-06-20 DIAGNOSIS — R7989 Other specified abnormal findings of blood chemistry: Secondary | ICD-10-CM | POA: Diagnosis not present

## 2017-06-20 DIAGNOSIS — R0602 Shortness of breath: Secondary | ICD-10-CM | POA: Diagnosis not present

## 2017-06-20 DIAGNOSIS — Z9981 Dependence on supplemental oxygen: Secondary | ICD-10-CM | POA: Diagnosis not present

## 2017-06-20 DIAGNOSIS — R000 Tachycardia, unspecified: Secondary | ICD-10-CM | POA: Diagnosis not present

## 2017-06-20 DIAGNOSIS — J811 Chronic pulmonary edema: Secondary | ICD-10-CM | POA: Diagnosis not present

## 2017-06-20 DIAGNOSIS — J9600 Acute respiratory failure, unspecified whether with hypoxia or hypercapnia: Secondary | ICD-10-CM | POA: Diagnosis not present

## 2017-06-20 DIAGNOSIS — E875 Hyperkalemia: Secondary | ICD-10-CM | POA: Diagnosis not present

## 2017-06-20 DIAGNOSIS — I2510 Atherosclerotic heart disease of native coronary artery without angina pectoris: Secondary | ICD-10-CM | POA: Diagnosis not present

## 2017-06-20 DIAGNOSIS — I428 Other cardiomyopathies: Secondary | ICD-10-CM | POA: Diagnosis not present

## 2017-06-20 DIAGNOSIS — E8779 Other fluid overload: Secondary | ICD-10-CM | POA: Diagnosis not present

## 2017-06-20 DIAGNOSIS — I120 Hypertensive chronic kidney disease with stage 5 chronic kidney disease or end stage renal disease: Secondary | ICD-10-CM | POA: Diagnosis not present

## 2017-06-20 DIAGNOSIS — I480 Paroxysmal atrial fibrillation: Secondary | ICD-10-CM | POA: Diagnosis not present

## 2017-06-20 DIAGNOSIS — Z794 Long term (current) use of insulin: Secondary | ICD-10-CM | POA: Diagnosis not present

## 2017-06-20 DIAGNOSIS — R0789 Other chest pain: Secondary | ICD-10-CM | POA: Diagnosis not present

## 2017-06-20 DIAGNOSIS — Z6822 Body mass index (BMI) 22.0-22.9, adult: Secondary | ICD-10-CM | POA: Diagnosis not present

## 2017-06-20 DIAGNOSIS — J9601 Acute respiratory failure with hypoxia: Secondary | ICD-10-CM | POA: Diagnosis not present

## 2017-06-20 DIAGNOSIS — Z7982 Long term (current) use of aspirin: Secondary | ICD-10-CM | POA: Diagnosis not present

## 2017-06-20 DIAGNOSIS — R748 Abnormal levels of other serum enzymes: Secondary | ICD-10-CM | POA: Diagnosis present

## 2017-06-20 DIAGNOSIS — I25119 Atherosclerotic heart disease of native coronary artery with unspecified angina pectoris: Secondary | ICD-10-CM | POA: Diagnosis present

## 2017-06-20 DIAGNOSIS — I482 Chronic atrial fibrillation: Secondary | ICD-10-CM | POA: Diagnosis not present

## 2017-06-20 DIAGNOSIS — R9431 Abnormal electrocardiogram [ECG] [EKG]: Secondary | ICD-10-CM | POA: Diagnosis not present

## 2017-06-20 DIAGNOSIS — R05 Cough: Secondary | ICD-10-CM | POA: Diagnosis not present

## 2017-06-20 DIAGNOSIS — Z79899 Other long term (current) drug therapy: Secondary | ICD-10-CM | POA: Diagnosis not present

## 2017-06-20 DIAGNOSIS — I132 Hypertensive heart and chronic kidney disease with heart failure and with stage 5 chronic kidney disease, or end stage renal disease: Secondary | ICD-10-CM | POA: Diagnosis not present

## 2017-06-24 DIAGNOSIS — I10 Essential (primary) hypertension: Secondary | ICD-10-CM | POA: Diagnosis not present

## 2017-06-24 DIAGNOSIS — N186 End stage renal disease: Secondary | ICD-10-CM | POA: Diagnosis not present

## 2017-06-24 DIAGNOSIS — D509 Iron deficiency anemia, unspecified: Secondary | ICD-10-CM | POA: Diagnosis not present

## 2017-06-24 DIAGNOSIS — N2581 Secondary hyperparathyroidism of renal origin: Secondary | ICD-10-CM | POA: Diagnosis not present

## 2017-06-24 DIAGNOSIS — D631 Anemia in chronic kidney disease: Secondary | ICD-10-CM | POA: Diagnosis not present

## 2017-06-27 DIAGNOSIS — N2581 Secondary hyperparathyroidism of renal origin: Secondary | ICD-10-CM | POA: Diagnosis not present

## 2017-06-27 DIAGNOSIS — I10 Essential (primary) hypertension: Secondary | ICD-10-CM | POA: Diagnosis not present

## 2017-06-27 DIAGNOSIS — D509 Iron deficiency anemia, unspecified: Secondary | ICD-10-CM | POA: Diagnosis not present

## 2017-06-27 DIAGNOSIS — D631 Anemia in chronic kidney disease: Secondary | ICD-10-CM | POA: Diagnosis not present

## 2017-06-27 DIAGNOSIS — N186 End stage renal disease: Secondary | ICD-10-CM | POA: Diagnosis not present

## 2017-06-29 ENCOUNTER — Observation Stay (HOSPITAL_COMMUNITY)
Admission: EM | Admit: 2017-06-29 | Discharge: 2017-06-30 | Disposition: A | Discharge status: Home / Self Care | Payer: Medicare Other | Attending: Internal Medicine | Admitting: Internal Medicine

## 2017-06-29 ENCOUNTER — Other Ambulatory Visit: Payer: Self-pay

## 2017-06-29 ENCOUNTER — Emergency Department (HOSPITAL_COMMUNITY): Payer: Medicare Other

## 2017-06-29 ENCOUNTER — Encounter (HOSPITAL_COMMUNITY): Payer: Self-pay

## 2017-06-29 DIAGNOSIS — E8770 Fluid overload, unspecified: Secondary | ICD-10-CM | POA: Diagnosis not present

## 2017-06-29 DIAGNOSIS — I10 Essential (primary) hypertension: Secondary | ICD-10-CM

## 2017-06-29 DIAGNOSIS — Z91199 Patient's noncompliance with other medical treatment and regimen due to unspecified reason: Secondary | ICD-10-CM | POA: Insufficient documentation

## 2017-06-29 DIAGNOSIS — D631 Anemia in chronic kidney disease: Secondary | ICD-10-CM | POA: Diagnosis not present

## 2017-06-29 DIAGNOSIS — N2581 Secondary hyperparathyroidism of renal origin: Secondary | ICD-10-CM | POA: Diagnosis not present

## 2017-06-29 DIAGNOSIS — E875 Hyperkalemia: Secondary | ICD-10-CM | POA: Insufficient documentation

## 2017-06-29 DIAGNOSIS — I160 Hypertensive urgency: Secondary | ICD-10-CM | POA: Diagnosis not present

## 2017-06-29 DIAGNOSIS — N186 End stage renal disease: Secondary | ICD-10-CM | POA: Insufficient documentation

## 2017-06-29 DIAGNOSIS — Z7901 Long term (current) use of anticoagulants: Secondary | ICD-10-CM | POA: Diagnosis not present

## 2017-06-29 DIAGNOSIS — I5021 Acute systolic (congestive) heart failure: Secondary | ICD-10-CM

## 2017-06-29 DIAGNOSIS — Z79899 Other long term (current) drug therapy: Secondary | ICD-10-CM | POA: Insufficient documentation

## 2017-06-29 DIAGNOSIS — D649 Anemia, unspecified: Secondary | ICD-10-CM | POA: Diagnosis present

## 2017-06-29 DIAGNOSIS — I482 Chronic atrial fibrillation: Principal | ICD-10-CM | POA: Insufficient documentation

## 2017-06-29 DIAGNOSIS — Z8673 Personal history of transient ischemic attack (TIA), and cerebral infarction without residual deficits: Secondary | ICD-10-CM | POA: Insufficient documentation

## 2017-06-29 DIAGNOSIS — I5033 Acute on chronic diastolic (congestive) heart failure: Secondary | ICD-10-CM | POA: Diagnosis not present

## 2017-06-29 DIAGNOSIS — I132 Hypertensive heart and chronic kidney disease with heart failure and with stage 5 chronic kidney disease, or end stage renal disease: Secondary | ICD-10-CM | POA: Insufficient documentation

## 2017-06-29 DIAGNOSIS — I480 Paroxysmal atrial fibrillation: Secondary | ICD-10-CM | POA: Insufficient documentation

## 2017-06-29 DIAGNOSIS — R0600 Dyspnea, unspecified: Secondary | ICD-10-CM | POA: Diagnosis present

## 2017-06-29 DIAGNOSIS — Z9119 Patient's noncompliance with other medical treatment and regimen: Secondary | ICD-10-CM | POA: Insufficient documentation

## 2017-06-29 DIAGNOSIS — Z9114 Patient's other noncompliance with medication regimen: Secondary | ICD-10-CM | POA: Insufficient documentation

## 2017-06-29 DIAGNOSIS — I4891 Unspecified atrial fibrillation: Secondary | ICD-10-CM | POA: Diagnosis present

## 2017-06-29 DIAGNOSIS — Z992 Dependence on renal dialysis: Secondary | ICD-10-CM | POA: Diagnosis not present

## 2017-06-29 DIAGNOSIS — I509 Heart failure, unspecified: Secondary | ICD-10-CM

## 2017-06-29 DIAGNOSIS — I120 Hypertensive chronic kidney disease with stage 5 chronic kidney disease or end stage renal disease: Secondary | ICD-10-CM | POA: Diagnosis not present

## 2017-06-29 HISTORY — DX: Unspecified atrial fibrillation: I48.91

## 2017-06-29 HISTORY — DX: Disorder of kidney and ureter, unspecified: N28.9

## 2017-06-29 HISTORY — DX: Patient's noncompliance with other medical treatment and regimen due to unspecified reason: Z91.199

## 2017-06-29 HISTORY — DX: Cerebral infarction, unspecified: I63.9

## 2017-06-29 HISTORY — DX: Essential (primary) hypertension: I10

## 2017-06-29 HISTORY — DX: Patient's noncompliance with other medical treatment and regimen: Z91.19

## 2017-06-29 HISTORY — DX: Unspecified diastolic (congestive) heart failure: I50.30

## 2017-06-29 LAB — RAPID URINE DRUG SCREEN, HOSP PERFORMED
AMPHETAMINES: NOT DETECTED
BARBITURATES: NOT DETECTED
BENZODIAZEPINES: NOT DETECTED
COCAINE: NOT DETECTED
Opiates: NOT DETECTED
Tetrahydrocannabinol: NOT DETECTED

## 2017-06-29 LAB — HEPATITIS B SURFACE ANTIGEN: HEP B S AG: NEGATIVE

## 2017-06-29 LAB — CBC
HCT: 32.4 % — ABNORMAL LOW (ref 39.0–52.0)
HEMOGLOBIN: 10.2 g/dL — AB (ref 13.0–17.0)
MCH: 29.7 pg (ref 26.0–34.0)
MCHC: 31.5 g/dL (ref 30.0–36.0)
MCV: 94.5 fL (ref 78.0–100.0)
Platelets: 209 10*3/uL (ref 150–400)
RBC: 3.43 MIL/uL — ABNORMAL LOW (ref 4.22–5.81)
RDW: 16.4 % — ABNORMAL HIGH (ref 11.5–15.5)
WBC: 9.9 10*3/uL (ref 4.0–10.5)

## 2017-06-29 LAB — COMPREHENSIVE METABOLIC PANEL
ALK PHOS: 77 U/L (ref 38–126)
ALT: 25 U/L (ref 17–63)
ANION GAP: 15 (ref 5–15)
AST: 22 U/L (ref 15–41)
Albumin: 3.2 g/dL — ABNORMAL LOW (ref 3.5–5.0)
BILIRUBIN TOTAL: 0.5 mg/dL (ref 0.3–1.2)
BUN: 72 mg/dL — ABNORMAL HIGH (ref 6–20)
CALCIUM: 9 mg/dL (ref 8.9–10.3)
CO2: 22 mmol/L (ref 22–32)
Chloride: 102 mmol/L (ref 101–111)
Creatinine, Ser: 11.09 mg/dL — ABNORMAL HIGH (ref 0.61–1.24)
GFR calc Af Amer: 6 mL/min — ABNORMAL LOW (ref >60)
GFR, EST NON AFRICAN AMERICAN: 5 mL/min — AB (ref >60)
Glucose, Bld: 105 mg/dL — ABNORMAL HIGH (ref 65–99)
Potassium: 6.4 mmol/L (ref 3.5–5.1)
Sodium: 139 mmol/L (ref 135–145)
TOTAL PROTEIN: 6.3 g/dL — AB (ref 6.5–8.1)

## 2017-06-29 LAB — TROPONIN I: TROPONIN I: 0.06 ng/mL — AB (ref <0.03)

## 2017-06-29 LAB — I-STAT CHEM 8, ED
BUN: 60 mg/dL — ABNORMAL HIGH (ref 6–20)
CREATININE: 10.6 mg/dL — AB (ref 0.61–1.24)
Calcium, Ion: 1.08 mmol/L — ABNORMAL LOW (ref 1.15–1.40)
Chloride: 104 mmol/L (ref 101–111)
GLUCOSE: 102 mg/dL — AB (ref 65–99)
HCT: 31 % — ABNORMAL LOW (ref 39.0–52.0)
Hemoglobin: 10.5 g/dL — ABNORMAL LOW (ref 13.0–17.0)
POTASSIUM: 6.3 mmol/L — AB (ref 3.5–5.1)
Sodium: 137 mmol/L (ref 135–145)
TCO2: 24 mmol/L (ref 22–32)

## 2017-06-29 LAB — GLUCOSE, CAPILLARY: GLUCOSE-CAPILLARY: 78 mg/dL (ref 65–99)

## 2017-06-29 LAB — MAGNESIUM: MAGNESIUM: 3.1 mg/dL — AB (ref 1.7–2.4)

## 2017-06-29 LAB — CREATININE, SERUM
Creatinine, Ser: 11.47 mg/dL — ABNORMAL HIGH (ref 0.61–1.24)
GFR calc Af Amer: 5 mL/min — ABNORMAL LOW (ref >60)
GFR, EST NON AFRICAN AMERICAN: 5 mL/min — AB (ref >60)

## 2017-06-29 MED ORDER — SODIUM CHLORIDE 0.9 % IV SOLN: 250.0000 mL | INTRAVENOUS | Status: DC | PRN

## 2017-06-29 MED ORDER — PENTAFLUOROPROP-TETRAFLUOROETH EX AERO: 1.0000 "application " | INHALATION_SPRAY | CUTANEOUS | Status: DC | PRN

## 2017-06-29 MED ORDER — FUROSEMIDE 10 MG/ML IJ SOLN
40.0000 mg | Freq: Once | INTRAMUSCULAR | Status: AC
Start: 2017-06-29 — End: 2017-06-29
  Administered 2017-06-29: 40 mg via INTRAVENOUS
  Filled 2017-06-29: qty 4

## 2017-06-29 MED ORDER — NITROGLYCERIN IN D5W 200-5 MCG/ML-% IV SOLN
0.0000 ug/min | Freq: Once | INTRAVENOUS | Status: AC
  Administered 2017-06-29: 5 ug/min via INTRAVENOUS
  Filled 2017-06-29: qty 250

## 2017-06-29 MED ORDER — SODIUM CHLORIDE 0.9% FLUSH: 3.0000 mL | INTRAVENOUS | Status: DC | PRN

## 2017-06-29 MED ORDER — SODIUM CHLORIDE 0.9 % IV SOLN
1.0000 g | Freq: Once | INTRAVENOUS | Status: AC
  Administered 2017-06-29: 1 g via INTRAVENOUS
  Filled 2017-06-29: qty 10

## 2017-06-29 MED ORDER — HEPARIN SODIUM (PORCINE) 1000 UNIT/ML DIALYSIS: 1000.0000 [IU] | INTRAMUSCULAR | Status: DC | PRN

## 2017-06-29 MED ORDER — DILTIAZEM HCL ER COATED BEADS 240 MG PO CP24
240.0000 mg | ORAL_CAPSULE | Freq: Every day | ORAL | Status: DC
  Administered 2017-06-30: 240 mg via ORAL
  Filled 2017-06-29 (×2): qty 1

## 2017-06-29 MED ORDER — SODIUM CHLORIDE 0.9 % IV SOLN: 100.0000 mL | INTRAVENOUS | Status: DC | PRN

## 2017-06-29 MED ORDER — AMLODIPINE BESYLATE 5 MG PO TABS
5.0000 mg | ORAL_TABLET | Freq: Every day | ORAL | Status: DC
  Administered 2017-06-30: 5 mg via ORAL
  Filled 2017-06-29: qty 1

## 2017-06-29 MED ORDER — APIXABAN 2.5 MG PO TABS
2.5000 mg | ORAL_TABLET | Freq: Two times a day (BID) | ORAL | Status: DC
  Administered 2017-06-29 – 2017-06-30 (×2): 2.5 mg via ORAL
  Filled 2017-06-29 (×3): qty 1

## 2017-06-29 MED ORDER — ALTEPLASE 2 MG IJ SOLR: 2.0000 mg | Freq: Once | INTRAMUSCULAR | Status: DC | PRN

## 2017-06-29 MED ORDER — LIDOCAINE-PRILOCAINE 2.5-2.5 % EX CREA: 1.0000 "application " | TOPICAL_CREAM | CUTANEOUS | Status: DC | PRN

## 2017-06-29 MED ORDER — SODIUM CHLORIDE 0.9% FLUSH
3.0000 mL | Freq: Two times a day (BID) | INTRAVENOUS | Status: DC
  Administered 2017-06-29 – 2017-06-30 (×3): 3 mL via INTRAVENOUS

## 2017-06-29 MED ORDER — ONDANSETRON HCL 4 MG/2ML IJ SOLN: 4.0000 mg | Freq: Four times a day (QID) | INTRAMUSCULAR | Status: DC | PRN

## 2017-06-29 MED ORDER — HEPARIN SODIUM (PORCINE) 5000 UNIT/ML IJ SOLN: 5000.0000 [IU] | Freq: Three times a day (TID) | INTRAMUSCULAR | Status: DC

## 2017-06-29 MED ORDER — LIDOCAINE HCL (PF) 1 % IJ SOLN: 5.0000 mL | INTRAMUSCULAR | Status: DC | PRN

## 2017-06-29 MED ORDER — HYDRALAZINE HCL 50 MG PO TABS
100.0000 mg | ORAL_TABLET | Freq: Three times a day (TID) | ORAL | Status: DC
  Administered 2017-06-29 – 2017-06-30 (×2): 100 mg via ORAL
  Filled 2017-06-29 (×2): qty 2

## 2017-06-29 MED ORDER — METOPROLOL SUCCINATE ER 100 MG PO TB24
100.0000 mg | ORAL_TABLET | Freq: Every day | ORAL | Status: DC
  Administered 2017-06-30: 100 mg via ORAL
  Filled 2017-06-29 (×2): qty 1

## 2017-06-29 MED ORDER — ACETAMINOPHEN 325 MG PO TABS
650.0000 mg | ORAL_TABLET | ORAL | Status: DC | PRN
  Administered 2017-06-30: 650 mg via ORAL

## 2017-06-29 MED ORDER — FUROSEMIDE 10 MG/ML IJ SOLN
40.0000 mg | Freq: Two times a day (BID) | INTRAMUSCULAR | Status: DC
  Administered 2017-06-30: 40 mg via INTRAVENOUS
  Filled 2017-06-29: qty 4

## 2017-06-29 NOTE — Progress Notes (Signed)
ANTICOAGULATION CONSULT NOTE - Initial Consult  Pharmacy Consult for apixaban Indication: atrial fibrillation  No Known Allergies  Patient Measurements: Height: 5\' 11"  (180.3 cm) Weight: 152 lb (68.9 kg) IBW/kg (Calculated) : 75.3 Heparin Dosing Weight: 68.9 kg   Vital Signs: Temp: 98.5 F (36.9 C) (05/08 0858) Temp Source: Oral (05/08 0858) BP: 146/104 (05/08 1132) Pulse Rate: 81 (05/08 1132)  Labs: Recent Labs    06/29/17 0915 06/29/17 0937  HGB 10.2* 10.5*  HCT 32.4* 31.0*  PLT 209  --   CREATININE 11.09* 10.60*    Estimated Creatinine Clearance: 8.6 mL/min (A) (by C-G formula based on SCr of 10.6 mg/dL (H)).   Medical History: Past Medical History:  Diagnosis Date  . Atrial fibrillation ([place])   . CVA (cerebral vascular accident) ([place])   . Diastolic heart failure ([place])   . Hypertension   . Non-compliance   . Renal disorder     Medications:   (Not in a hospital admission)  Assessment: 46 YOM on apixaban at home for h/o Afib. Pharmacy consulted to resume anticoag. Per patient his last dose of apixaban was this morning. Called both his pharmacies and they have no record of a recent fill for apixaban. Per [facility] discharge orders, patient was discharged on apixaban 2.5 mg twice daily. Of note, he has a h/o CKD on MWF HD. H/H low, Plt wnl   Goal of Therapy:  Stroke prevention Monitor platelets by anticoagulation protocol: Yes   Plan:  -Resume apixaban 2.5 mg twice daily -Monitor for s/s of bleeding   Albertina Parr, PharmD., BCPS Clinical Pharmacist Clinical phone for 06/29/17 until 3:30pm: [phone] If after 3:30pm, please call main pharmacy at: [phone]

## 2017-06-29 NOTE — H&P (Signed)
History and Physical    Samuel Sparks CHY:[id number] DOB: 02/11/1971 DOA: 06/29/2017  PCP: No primary care provider on file. Patient coming from: dialysis center  Chief Complaint: sob  HPI: Samuel Sparks is a 47 y.o. male with medical history significant for end-stage renal disease Monday Wednesday Friday dialysis patient in 20,uncontrolled hypertension, paroxysmal A. Fib on risk, CVA, anemia, noncompliance presents to the emergency department from the dialysis center with chief complaint of shortness of breath. Initial evaluationr eveals acute on chronic HF likely related to volume overload secondary to uncontrolled BP in setting of non-compliance. That hospitalists are asked to admit.  Information is obtained from the patient. He states he went to dialysis 2 days ago and had a full session. He was in his usual state of health until yesterday evening he developed sudden shortness of breath. He reports he reported to dialysis this morning in spite of his worsening shortness of breath. Chart review indicates at the dialysis center was placed on a nonrebreather his respiratory rate improved as did his oxygen saturation level. He reports compliance with his medications and dialysis sessions however chart review indicates frequent admissions for same scenario. Typically he goes to [facility] according to the chart he is also in pre-transplant workup. He denies headache dizziness syncope or near-syncope. He denies chest pain palpitations lower extremity edema. He denies fever chills cough abdominal pain nausea vomiting. He states he does continue to make urine  approximately twice a day. He denies any diarrhea constipation melena bright red blood per rectum. Of note patient most recently discharged 5 days ago.    ED Course: in the emergency department he's afebrile, hypertensive and saturation level greater than 90% on 4 L nasal cannula. Provided with IV Lasix and nitroglycerin  drip.  Review of Systems: As per HPI otherwise all other systems reviewed and are negative.   Ambulatory Status: ambulates independently is independent with ADLs  Past Medical History:  Diagnosis Date  . Atrial fibrillation ([place])   . CVA (cerebral vascular accident) ([place])   . Diastolic heart failure ([place])   . Hypertension   . Non-compliance   . Renal disorder     History reviewed. No pertinent surgical history.  Social History   Socioeconomic History  . Marital status: Unknown    Spouse name: Not on file  . Number of children: Not on file  . Years of education: Not on file  . Highest education level: Not on file  Occupational History  . Not on file  Social Needs  . Financial resource strain: Not on file  . Food insecurity:    Worry: Not on file    Inability: Not on file  . Transportation needs:    Medical: Not on file    Non-medical: Not on file  Tobacco Use  . Smoking status: Never Smoker  . Smokeless tobacco: Never Used  Substance and Sexual Activity  . Alcohol use: Not Currently  . Drug use: Not on file  . Sexual activity: Not on file  Lifestyle  . Physical activity:    Days per week: Not on file    Minutes per session: Not on file  . Stress: Not on file  Relationships  . Social connections:    Talks on phone: Not on file    Gets together: Not on file    Attends religious service: Not on file    Active member of club or organization: Not on file    Attends meetings of  clubs or organizations: Not on file    Relationship status: Not on file  . Intimate partner violence:    Fear of current or ex partner: Not on file    Emotionally abused: Not on file    Physically abused: Not on file    Forced sexual activity: Not on file  Other Topics Concern  . Not on file  Social History Narrative  . Not on file    No Known Allergies  History reviewed. No pertinent family history.  Prior to Admission medications   Not on File    Physical Exam: Vitals:    06/29/17 0929 06/29/17 0930 06/29/17 1102 06/29/17 1132  BP: (!) 170/112  (!) 161/100 (!) 146/104  Pulse: 82  82 81  Resp: (!) 25  (!) 23 (!) 27  Temp:      TempSrc:      SpO2: 97%  96% 94%  Weight:  68.9 kg (152 lb)    Height:  5\' 11"  (1.803 m)       General:  Appears calm and comfortable sitting up in bed in no acute distress Eyes:  PERRL, EOMI, normal lids, iris ENT:  grossly normal hearing, lips & tongue, mucous membranes of his mouth are moist and pink Neck:  no LAD, masses or thyromegaly Cardiovascular:  RRR, no m/r/g. No LE edema.  Respiratory:  Mild increased work of breathing sounds with good air flow. I hear no wheezes no crackles Abdomen:  soft, ntnd, positive bowel sounds no guarding or rebounding Skin:  no rash or induration seen on limited exam Musculoskeletal:  grossly normal tone BUE/BLE, good ROM, no bony abnormality Psychiatric:  grossly normal mood and affect, speech fluent and appropriate, AOx3 Neurologic:  CN 2-12 grossly intact, moves all extremities in coordinated fashion, sensation intact  Labs on Admission: I have personally reviewed following labs and imaging studies  CBC: Recent Labs  Lab 06/29/17 0915 06/29/17 0937  WBC 9.9  --   HGB 10.2* 10.5*  HCT 32.4* 31.0*  MCV 94.5  --   PLT 209  --    Basic Metabolic Panel: Recent Labs  Lab 06/29/17 0915 06/29/17 0937  NA 139 137  K 6.4* 6.3*  CL 102 104  CO2 22  --   GLUCOSE 105* 102*  BUN 72* 60*  CREATININE 11.09* 10.60*  CALCIUM 9.0  --   MG 3.1*  --    GFR: Estimated Creatinine Clearance: 8.6 mL/min (A) (by C-G formula based on SCr of 10.6 mg/dL (H)). Liver Function Tests: Recent Labs  Lab 06/29/17 0915  AST 22  ALT 25  ALKPHOS 77  BILITOT 0.5  PROT 6.3*  ALBUMIN 3.2*   No results for input(s): LIPASE, AMYLASE in the last 168 hours. No results for input(s): AMMONIA in the last 168 hours. Coagulation Profile: No results for input(s): INR, PROTIME in the last 168  hours. Cardiac Enzymes: No results for input(s): CKTOTAL, CKMB, CKMBINDEX, TROPONINI in the last 168 hours. BNP (last 3 results) No results for input(s): PROBNP in the last 8760 hours. HbA1C: No results for input(s): HGBA1C in the last 72 hours. CBG: No results for input(s): GLUCAP in the last 168 hours. Lipid Profile: No results for input(s): CHOL, HDL, LDLCALC, TRIG, CHOLHDL, LDLDIRECT in the last 72 hours. Thyroid Function Tests: No results for input(s): TSH, T4TOTAL, FREET4, T3FREE, THYROIDAB in the last 72 hours. Anemia Panel: No results for input(s): VITAMINB12, FOLATE, FERRITIN, TIBC, IRON, RETICCTPCT in the last 72 hours. Urine analysis: No  results found for: COLORURINE, APPEARANCEUR, LABSPEC, PHURINE, GLUCOSEU, HGBUR, BILIRUBINUR, KETONESUR, PROTEINUR, UROBILINOGEN, NITRITE, LEUKOCYTESUR  Creatinine Clearance: Estimated Creatinine Clearance: 8.6 mL/min (A) (by C-G formula based on SCr of 10.6 mg/dL (H)).  Sepsis Labs: @LABRCNTIP (procalcitonin:4,lacticidven:4) )No results found for this or any previous visit (from the past 240 hour(s)).   Radiological Exams on Admission: Dg Chest Port 1 View  Result Date: 06/29/2017 CLINICAL DATA:  Shortness of breath and weakness EXAM: PORTABLE CHEST 1 VIEW COMPARISON:  Chest x-ray of June 21, 2015 FINDINGS: The lungs are adequately inflated. The interstitial markings are increased and there is near confluence of these markings in the perihilar and infrahilar regions. There is no pleural effusion. The cardiac silhouette is enlarged and the pulmonary vascularity engorged. There is mild curvature of the lower thoracic spine convex toward the right. IMPRESSION: CHF with pulmonary interstitial and alveolar edema. The appearance of the chest has not significantly improved since the earlier study. Electronically Signed   By: David  Martinique M.D.   On: 06/29/2017 10:23    EKG: Independently reviewed. Probable left atrial enlargement No previous ECGs  available  Assessment/Plan Principal Problem:   Acute heart failure (HCC) Active Problems:   Dyspnea   Hypertension   Atrial fibrillation (HCC)   ESRD (end stage renal disease) (HCC)   Hyperkalemia   Anemia   1. Acute heart failure likely diastolic. Chest x-ray with CHF with pulmonary interstitial and alveolar edema. Chart review indicates he has a history of diastolic heart failure. Likely related to uncontrolled blood pressure in the setting of noncompliance. He reports having had a full dialysis session 2 days ago.chart review indicates home medications include metoprolol -admit to step down -continue nitroglycerin drip -resume home meds -wean ntg gtt as able -obtain an echocardiogram -monitor intake and output -obtain daily weights -Nephrology consult  #2.hypertension. Uncontrolled. Provided with nitroglycerin drip in the emergency department. Chart review indicates home medications include amlodipine, hydralazine, Cardizem, metoprolol and recently lisinopril was discontinued. -resume hydralazine amlodipine Cardizem and metoprolol -Monitor closely -Wean nitroglycerin drip as able  #3. End-stage renal disease/hyperkalemia. Potassium level greater than 6.hemoglobin greater than 10. Reports compliance. -dialysis per nephrology  4. Atrial fibrillation. Home medications include metoprolol cardizem and eliquis. EKG as noted above. -continue home meds  #5. Anemia of chronic disease. Hemoglobin 10. No s/sx active bleeding -monitor   DVT prophylaxis: eliquis  Code Status: full  Family Communication: none present  Disposition Plan: home  Consults called: [facility] neprhology  Admission status: obs    Dyanne Carrel M MD [facility]  If 7PM-7AM, please contact night-coverage [email or web address] Password [facility]  06/29/2017, 12:35 PM

## 2017-06-29 NOTE — Consult Note (Signed)
Referring Provider: No ref. provider found Primary Care Physician:  [place] Primary Nephrologist:  Dr. Ellender Hose dialysis   Reason for Consultation:   Medical management of ESRD  Anemia and secondary hyperparathyrodism  HPI:  End stage renal disease  Developed dyspnea and cough this morning  Felt as though he was drowning  He is compliant although states that on Monday he had only a little fluid on. He started dialysis in December after a brief hiatus off dialysis   Past Medical History:  Diagnosis Date  . Atrial fibrillation ([place])   . CVA (cerebral vascular accident) ([place])   . Diastolic heart failure ([place])   . Hypertension   . Non-compliance   . Renal disorder     History reviewed. No pertinent surgical history.  Prior to Admission medications   Medication Sig Start Date End Date Taking? Authorizing Provider  acetaminophen (TYLENOL) 325 MG tablet Take 650 mg by mouth every 6 (six) hours as needed for mild pain.   Yes [provider]  amLODipine (NORVASC) 5 MG tablet Take 5 mg by mouth daily.   Yes [provider]  diltiazem (CARDIZEM CD) 240 MG 24 hr capsule Take 240 mg by mouth daily.   Yes [provider]  hydrALAZINE (APRESOLINE) 100 MG tablet Take 100 mg by mouth every 8 (eight) hours.   Yes [provider]  isosorbide mononitrate (IMDUR) 60 MG 24 hr tablet Take 60 mg by mouth every evening.   Yes [provider]  lisinopril (PRINIVIL,ZESTRIL) 40 MG tablet Take 40 mg by mouth daily.   Yes [provider]    Current Facility-Administered Medications  Medication Dose Route Frequency Provider Last Rate Last Dose  . 0.9 %  sodium chloride infusion  250 mL Intravenous PRN Radene Gunning, NP      . acetaminophen (TYLENOL) tablet 650 mg  650 mg Oral Q4H PRN Black, Karen M, NP      . amLODipine (NORVASC) tablet 5 mg  5 mg Oral Daily Black, Karen M, NP      . apixaban (ELIQUIS) tablet 2.5 mg  2.5 mg  Oral BID Mancheril, Darnell Level, RPH      . diltiazem (CARDIZEM CD) 24 hr capsule 240 mg  240 mg Oral Daily Black, Karen M, NP      . furosemide (LASIX) injection 40 mg  40 mg Intravenous BID Black, Karen M, NP      . hydrALAZINE (APRESOLINE) tablet 100 mg  100 mg Oral Q8H Black, Lezlie Octave, NP      . metoprolol succinate (TOPROL-XL) 24 hr tablet 100 mg  100 mg Oral Daily Black, Karen M, NP      . ondansetron [facility]) injection 4 mg  4 mg Intravenous Q6H PRN Radene Gunning, NP      . sodium chloride flush (NS) 0.9 % injection 3 mL  3 mL Intravenous Q12H Radene Gunning, NP   3 mL at 06/29/17 1223  . sodium chloride flush (NS) 0.9 % injection 3 mL  3 mL Intravenous PRN Black, Lezlie Octave, NP       Current Outpatient Medications  Medication Sig Dispense Refill  . acetaminophen (TYLENOL) 325 MG tablet Take 650 mg by mouth every 6 (six) hours as needed for mild pain.    Marland Kitchen amLODipine (NORVASC) 5 MG tablet Take 5 mg by mouth daily.    Marland Kitchen diltiazem (CARDIZEM CD) 240 MG 24 hr capsule Take 240 mg by mouth daily.    Marland Kitchen  hydrALAZINE (APRESOLINE) 100 MG tablet Take 100 mg by mouth every 8 (eight) hours.    . isosorbide mononitrate (IMDUR) 60 MG 24 hr tablet Take 60 mg by mouth every evening.    Marland Kitchen lisinopril (PRINIVIL,ZESTRIL) 40 MG tablet Take 40 mg by mouth daily.      Allergies as of 06/29/2017  . (No Known Allergies)    History reviewed. No pertinent family history.  Social History   Socioeconomic History  . Marital status: Unknown    Spouse name: Not on file  . Number of children: Not on file  . Years of education: Not on file  . Highest education level: Not on file  Occupational History  . Not on file  Social Needs  . Financial resource strain: Not on file  . Food insecurity:    Worry: Not on file    Inability: Not on file  . Transportation needs:    Medical: Not on file    Non-medical: Not on file  Tobacco Use  . Smoking status: Never Smoker  . Smokeless tobacco: Never Used  Substance and  Sexual Activity  . Alcohol use: Not Currently  . Drug use: Not on file  . Sexual activity: Not on file  Lifestyle  . Physical activity:    Days per week: Not on file    Minutes per session: Not on file  . Stress: Not on file  Relationships  . Social connections:    Talks on phone: Not on file    Gets together: Not on file    Attends religious service: Not on file    Active member of club or organization: Not on file    Attends meetings of clubs or organizations: Not on file    Relationship status: Not on file  . Intimate partner violence:    Fear of current or ex partner: Not on file    Emotionally abused: Not on file    Physically abused: Not on file    Forced sexual activity: Not on file  Other Topics Concern  . Not on file  Social History Narrative  . Not on file    Review of Systems: Gen: Denies any fever, chills, sweats, anorexia, fatigue, weakness, malaise, weight loss, and sleep disorder HEENT: No visual complaints, No history of Retinopathy. Normal external appearance No Epistaxis or Sore throat. No sinusitis.   CV: Denies chest pain, angina, palpitations, syncope, orthopnea, PND, peripheral edema, and claudication. Resp:  Complaints of dyspnea on lying flat and on minimal exertion GI: Denies vomiting blood, jaundice, and fecal incontinence.   Denies dysphagia or odynophagia. GU : Denies urinary burning, blood in urine, urinary frequency, urinary hesitancy, nocturnal urination, and urinary incontinence.  No renal calculi. MS: Denies joint pain, limitation of movement, and swelling, stiffness, low back pain, extremity pain. Denies muscle weakness, cramps, atrophy.  No use of non steroidal antiinflammatory drugs. Derm: Denies rash, itching, dry skin, hives, moles, warts, or unhealing ulcers.  Heme: Denies bruising, bleeding, and enlarged lymph nodes. Neuro: No headache.  No diplopia. No dysarthria.  No dysphasia.  No history of CVA.  No Seizures. No paresthesias.  No  weakness. Endocrine No DM.  No Thyroid disease.  No Adrenal disease.  Physical Exam: Vital signs in last 24 hours: Temp:  [98.5 F (36.9 C)] 98.5 F (36.9 C) (05/08 0858) Pulse Rate:  [80-91] 80 (05/08 1254) Resp:  [23-31] 30 (05/08 1254) BP: (146-184)/(99-118) 163/106 (05/08 1254) SpO2:  [86 %-98 %] 91 % (05/08 1254) Weight:  [  152 lb (68.9 kg)] 152 lb (68.9 kg) (05/08 0930)   General:   Alert,  Well-developed, well-nourished, pleasant and cooperative in NAD Head:  Normocephalic and atraumatic. Eyes:  Sclera clear, no icterus.   Conjunctiva pink. Ears:  Normal auditory acuity. Nose:  No deformity, discharge,  or lesions. Mouth:  No deformity or lesions, dentition normal. Neck:  Supple; no masses or thyromegaly. JVP not elevated Lungs:   Rales heard throughout the lungs  Heart:  Regular rate and rhythm; no murmurs, clicks, rubs,  or gallops. Abdomen:  Soft, nontender and nondistended. No masses, hepatosplenomegaly or hernias noted. Normal bowel sounds, without guarding, and without rebound.   Msk:  Symmetrical without gross deformities. Normal posture. Pulses:  No carotid, renal, femoral bruits. DP and PT symmetrical and equal Extremities:  Without clubbing or edema. AVF in place left arm   Neurologic:  Alert and  oriented x4;  grossly normal neurologically. Skin:  Intact without significant lesions or rashes.    Intake/Output from previous day: No intake/output data recorded. Intake/Output this shift: No intake/output data recorded.  Lab Results: Recent Labs    06/29/17 0915 06/29/17 0937  WBC 9.9  --   HGB 10.2* 10.5*  HCT 32.4* 31.0*  PLT 209  --    BMET Recent Labs    06/29/17 0915 06/29/17 0937 06/29/17 1301  NA 139 137  --   K 6.4* 6.3*  --   CL 102 104  --   CO2 22  --   --   GLUCOSE 105* 102*  --   BUN 72* 60*  --   CREATININE 11.09* 10.60* 11.47*  CALCIUM 9.0  --   --    LFT Recent Labs    06/29/17 0915  PROT 6.3*  ALBUMIN 3.2*  AST 22  ALT  25  ALKPHOS 77  BILITOT 0.5   PT/INR No results for input(s): LABPROT, INR in the last 72 hours. Hepatitis Panel No results for input(s): HEPBSAG, HCVAB, HEPAIGM, HEPBIGM in the last 72 hours.  Studies/Results: Dg Chest Port 1 View  Result Date: 06/29/2017 CLINICAL DATA:  Shortness of breath and weakness EXAM: PORTABLE CHEST 1 VIEW COMPARISON:  Chest x-ray of June 21, 2015 FINDINGS: The lungs are adequately inflated. The interstitial markings are increased and there is near confluence of these markings in the perihilar and infrahilar regions. There is no pleural effusion. The cardiac silhouette is enlarged and the pulmonary vascularity engorged. There is mild curvature of the lower thoracic spine convex toward the right. IMPRESSION: CHF with pulmonary interstitial and alveolar edema. The appearance of the chest has not significantly improved since the earlier study. Electronically Signed   By: David  Martinique M.D.   On: 06/29/2017 10:23    Assessment/Plan:  ESRD- MWF dialysis will arrange urgent dialysis today     ANEMIA- stable greater than 10  No esa needed at the present time  MBD- stable  HTN/VOL- volume overload  Will continue dialysis  ACCESS-  AVF  No issues    LOS: 0 Jc Veron W @TODAY @1 :58 PM

## 2017-06-29 NOTE — ED Triage Notes (Signed)
[facility]- pt coming from dialysis with complaint of acute SOB. Dialysis graft accessed but no treatment was ever started. Pt was initially pale and noted to be tripoding. Pt placed on NRB at 10l/min. Resp rate improved from 26 to 20. Spo2 improved to 96% from 90%. Pt noted to be 91% ra on arrival, placed on 4L nasal cannula and improved to 94%. Pt has hx of pulmonary edema.

## 2017-06-29 NOTE — ED Notes (Signed)
Patient transported to dialysis via RN on monitor.

## 2017-06-29 NOTE — ED Provider Notes (Addendum)
[facility] EMERGENCY DEPARTMENT Provider Note   CSN: [id number] Arrival date & time: 06/29/17  0847     History   Chief Complaint Chief Complaint  Patient presents with  . Respiratory Distress    HPI Samuel Sparks is a 47 y.o. male.  Patient brought in by EMS [facility] from [facility] dialysis [facility].  Patient normally dialyzed Monday Wednesdays Fridays and he was dialyzed on Monday as scheduled.  Today after access of his left arm AV fistula patient started to have severe shortness of breath and was tripoding.  Patient was placed on a nonrebreather 100% oxygen mask respiratory [place] rate improved from 26-20 patient sats improved to 96% from 90%.  Patient noted to be 91% room air on arrival and was placed on 4 L nasal cannula with oxygen sats at 94.  Patient has a history of pulmonary edema.  Patient recently admitted [facility] discharged home on May 2 appears this was due to pulmonary edema.  Patient states he started dialysis in December still makes some urine.  It appears all his care is been in the [facility] regional area.  Patient is feeling better upon arrival.     Past Medical History:  Diagnosis Date  . Atrial fibrillation ([place])   . Hypertension   . Renal disorder     There are no active problems to display for this patient.   History reviewed. No pertinent surgical history.      Home Medications    Prior to Admission medications   Not on File    Family History History reviewed. No pertinent family history.  Social History Social History   Tobacco Use  . Smoking status: Never Smoker  . Smokeless tobacco: Never Used  Substance Use Topics  . Alcohol use: Not Currently  . Drug use: Not on file     Allergies   Patient has no known allergies.   Review of Systems Review of Systems  Constitutional: Negative for fever.  HENT: Negative for congestion.   Eyes: Negative for redness.  Respiratory: Positive for  shortness of breath.   Cardiovascular: Negative for chest pain.  Gastrointestinal: Negative for abdominal pain, nausea and vomiting.  Genitourinary: Negative for hematuria.  Musculoskeletal: Negative for back pain.  Skin: Negative for rash.  Neurological: Negative for syncope.  Hematological: Does not bruise/bleed easily.  Psychiatric/Behavioral: Negative for confusion.     Physical Exam Updated Vital Signs BP (!) 161/100 (BP Location: Right Arm)   Pulse 82   Temp 98.5 F (36.9 C) (Oral)   Resp (!) 23   Ht 1.803 m (5\' 11" )   Wt 68.9 kg (152 lb)   SpO2 96%   BMI 21.20 kg/m   Physical Exam  Constitutional: He is oriented to person, place, and time. He appears well-developed and well-nourished. He appears distressed.  HENT:  Head: Normocephalic and atraumatic.  Eyes: Pupils are equal, round, and reactive to light. Conjunctivae and EOM are normal.  Neck: Neck supple.  Cardiovascular: Normal rate and regular rhythm.  Pulmonary/Chest: He is in respiratory distress. He has no wheezes. He has rales.  Abdominal: Soft. Bowel sounds are normal. There is no tenderness.  Musculoskeletal: Normal range of motion. He exhibits edema.  AV fistula left arm with catheter in place.  Neurological: He is alert and oriented to person, place, and time. No cranial nerve deficit or sensory deficit. He exhibits normal muscle tone. Coordination normal.  Skin: Skin is warm.  Nursing note and vitals  reviewed.    ED Treatments / Results  Labs (all labs ordered are listed, but only abnormal results are displayed) Labs Reviewed  CBC - Abnormal; Notable for the following components:      Result Value   RBC 3.43 (*)    Hemoglobin 10.2 (*)    HCT 32.4 (*)    RDW 16.4 (*)    All other components within normal limits  COMPREHENSIVE METABOLIC PANEL - Abnormal; Notable for the following components:   Potassium 6.4 (*)    Glucose, Bld 105 (*)    BUN 72 (*)    Creatinine, Ser 11.09 (*)    Total  Protein 6.3 (*)    Albumin 3.2 (*)    GFR calc non Af Amer 5 (*)    GFR calc Af Amer 6 (*)    All other components within normal limits  MAGNESIUM - Abnormal; Notable for the following components:   Magnesium 3.1 (*)    All other components within normal limits  I-STAT CHEM 8, ED - Abnormal; Notable for the following components:   Potassium 6.3 (*)    BUN 60 (*)    Creatinine, Ser 10.60 (*)    Glucose, Bld 102 (*)    Calcium, Ion 1.08 (*)    Hemoglobin 10.5 (*)    HCT 31.0 (*)    All other components within normal limits    EKG EKG Interpretation  Date/Time:  Wednesday Jun 29 2017 08:58:59 EDT Ventricular Rate:  89 PR Interval:    QRS Duration: 94 QT Interval:  375 QTC Calculation: 457 R Axis:   74 Text Interpretation:  Age not entered, assumed to be  47 years old for purpose of ECG interpretation Sinus rhythm Probable left atrial enlargement No previous ECGs available Confirmed by Fredia Sorrow [phone]) on 06/29/2017 9:45:30 AM  Results for orders placed or performed during the hospital encounter of 06/29/17  CBC  Result Value Ref Range   WBC 9.9 4.0 - 10.5 K/uL   RBC 3.43 (L) 4.22 - 5.81 MIL/uL   Hemoglobin 10.2 (L) 13.0 - 17.0 g/dL   HCT 32.4 (L) 39.0 - 52.0 %   MCV 94.5 78.0 - 100.0 fL   MCH 29.7 26.0 - 34.0 pg   MCHC 31.5 30.0 - 36.0 g/dL   RDW 16.4 (H) 11.5 - 15.5 %   Platelets 209 150 - 400 K/uL  Comprehensive metabolic panel  Result Value Ref Range   Sodium 139 135 - 145 mmol/L   Potassium 6.4 (HH) 3.5 - 5.1 mmol/L   Chloride 102 101 - 111 mmol/L   CO2 22 22 - 32 mmol/L   Glucose, Bld 105 (H) 65 - 99 mg/dL   BUN 72 (H) 6 - 20 mg/dL   Creatinine, Ser 11.09 (H) 0.61 - 1.24 mg/dL   Calcium 9.0 8.9 - 10.3 mg/dL   Total Protein 6.3 (L) 6.5 - 8.1 g/dL   Albumin 3.2 (L) 3.5 - 5.0 g/dL   AST 22 15 - 41 U/L   ALT 25 17 - 63 U/L   Alkaline Phosphatase 77 38 - 126 U/L   Total Bilirubin 0.5 0.3 - 1.2 mg/dL   GFR calc non Af Amer 5 (L) >60 mL/min   GFR calc Af  Amer 6 (L) >60 mL/min   Anion gap 15 5 - 15  Magnesium  Result Value Ref Range   Magnesium 3.1 (H) 1.7 - 2.4 mg/dL  I-stat Chem 8, ED  Result Value Ref Range   Sodium  137 135 - 145 mmol/L   Potassium 6.3 (HH) 3.5 - 5.1 mmol/L   Chloride 104 101 - 111 mmol/L   BUN 60 (H) 6 - 20 mg/dL   Creatinine, Ser 10.60 (H) 0.61 - 1.24 mg/dL   Glucose, Bld 102 (H) 65 - 99 mg/dL   Calcium, Ion 1.08 (L) 1.15 - 1.40 mmol/L   TCO2 24 22 - 32 mmol/L   Hemoglobin 10.5 (L) 13.0 - 17.0 g/dL   HCT 31.0 (L) 39.0 - 52.0 %   Comment NOTIFIED PHYSICIAN     Radiology Dg Chest Port 1 View  Result Date: 06/29/2017 CLINICAL DATA:  Shortness of breath and weakness EXAM: PORTABLE CHEST 1 VIEW COMPARISON:  Chest x-ray of June 21, 2015 FINDINGS: The lungs are adequately inflated. The interstitial markings are increased and there is near confluence of these markings in the perihilar and infrahilar regions. There is no pleural effusion. The cardiac silhouette is enlarged and the pulmonary vascularity engorged. There is mild curvature of the lower thoracic spine convex toward the right. IMPRESSION: CHF with pulmonary interstitial and alveolar edema. The appearance of the chest has not significantly improved since the earlier study. Electronically Signed   By: David  Martinique M.D.   On: 06/29/2017 10:23    Procedures Procedures (including critical care time)  CRITICAL CARE Performed by: Fredia Sorrow Total critical care time: 60 minutes Critical care time was exclusive of separately billable procedures and treating other patients. Critical care was necessary to treat or prevent imminent or life-threatening deterioration. Critical care was time spent personally by me on the following activities: development of treatment plan with patient and/or surrogate as well as nursing, discussions with consultants, evaluation of patient's response to treatment, examination of patient, obtaining history from patient or surrogate,  ordering and performing treatments and interventions, ordering and review of laboratory studies, ordering and review of radiographic studies, pulse oximetry and re-evaluation of patient's condition.  Medications Ordered in ED Medications  furosemide (LASIX) injection 40 mg (40 mg Intravenous Given 06/29/17 1056)  nitroGLYCERIN 50 mg in dextrose 5 % 250 mL (0.2 mg/mL) infusion (5 mcg/min Intravenous New Bag/Given 06/29/17 1056)     Initial Impression / Assessment and Plan / ED Course  I have reviewed the triage vital signs and the nursing notes.  Pertinent labs & imaging results that were available during my care of the patient were reviewed by me and considered in my medical decision making (see chart for details).     Patient arrived in some respiratory distress but not severe not tripoding.  Rales bilaterally.  Patient hypertensive.  Work-up showed some persistent hypertension started on nitroglycerin drip and also given Lasix chest x-ray consistent with congestive heart failure.  Very similar to chart review from his admissions to [facility] regional.  Potassium elevated as well in the 6 range EKG without acute changes.  Patient started on some calcium gluconate to help protect the heart from the elevated potassium.  Patient most likely will require dialysis.  Patient will be admitted by the hospitalist service.  Also consult out to nephrology but they have not called back.  Patient with improvement in the blood pressure on low-dose nitroglycerin drip.  Patient with some improvement in the breathing.  Troponin is pending.  Patient did not have any chest pain.  Final Clinical Impressions(s) / ED Diagnoses   Final diagnoses:  End stage renal disease on dialysis ([place])  Congestive heart failure, unspecified HF chronicity, unspecified heart failure type ([place])  Hyperkalemia  Hypertension, unspecified type    ED Discharge Orders    None       Fredia Sorrow, MD 06/29/17 1227      Fredia Sorrow, MD 06/29/17 [phone]

## 2017-06-30 ENCOUNTER — Observation Stay (HOSPITAL_BASED_OUTPATIENT_CLINIC_OR_DEPARTMENT_OTHER): Payer: Medicare Other

## 2017-06-30 DIAGNOSIS — I509 Heart failure, unspecified: Secondary | ICD-10-CM | POA: Diagnosis not present

## 2017-06-30 DIAGNOSIS — I482 Chronic atrial fibrillation: Secondary | ICD-10-CM | POA: Diagnosis not present

## 2017-06-30 DIAGNOSIS — N2581 Secondary hyperparathyroidism of renal origin: Secondary | ICD-10-CM | POA: Diagnosis not present

## 2017-06-30 DIAGNOSIS — Z992 Dependence on renal dialysis: Secondary | ICD-10-CM | POA: Diagnosis not present

## 2017-06-30 DIAGNOSIS — I120 Hypertensive chronic kidney disease with stage 5 chronic kidney disease or end stage renal disease: Secondary | ICD-10-CM | POA: Diagnosis not present

## 2017-06-30 DIAGNOSIS — E8770 Fluid overload, unspecified: Secondary | ICD-10-CM | POA: Diagnosis not present

## 2017-06-30 DIAGNOSIS — N186 End stage renal disease: Secondary | ICD-10-CM | POA: Diagnosis not present

## 2017-06-30 DIAGNOSIS — I340 Nonrheumatic mitral (valve) insufficiency: Secondary | ICD-10-CM | POA: Diagnosis not present

## 2017-06-30 DIAGNOSIS — I361 Nonrheumatic tricuspid (valve) insufficiency: Secondary | ICD-10-CM | POA: Diagnosis not present

## 2017-06-30 DIAGNOSIS — D631 Anemia in chronic kidney disease: Secondary | ICD-10-CM | POA: Diagnosis not present

## 2017-06-30 LAB — CBC
HEMATOCRIT: 30 % — AB (ref 39.0–52.0)
Hemoglobin: 9.6 g/dL — ABNORMAL LOW (ref 13.0–17.0)
MCH: 30 pg (ref 26.0–34.0)
MCHC: 32 g/dL (ref 30.0–36.0)
MCV: 93.8 fL (ref 78.0–100.0)
Platelets: 212 10*3/uL (ref 150–400)
RBC: 3.2 MIL/uL — ABNORMAL LOW (ref 4.22–5.81)
RDW: 16.3 % — AB (ref 11.5–15.5)
WBC: 6.9 10*3/uL (ref 4.0–10.5)

## 2017-06-30 LAB — BASIC METABOLIC PANEL
Anion gap: 9 (ref 5–15)
BUN: 28 mg/dL — AB (ref 6–20)
CHLORIDE: 99 mmol/L — AB (ref 101–111)
CO2: 30 mmol/L (ref 22–32)
CREATININE: 6.2 mg/dL — AB (ref 0.61–1.24)
Calcium: 8.8 mg/dL — ABNORMAL LOW (ref 8.9–10.3)
GFR calc Af Amer: 11 mL/min — ABNORMAL LOW (ref >60)
GFR calc non Af Amer: 10 mL/min — ABNORMAL LOW (ref >60)
GLUCOSE: 84 mg/dL (ref 65–99)
POTASSIUM: 4.7 mmol/L (ref 3.5–5.1)
Sodium: 138 mmol/L (ref 135–145)

## 2017-06-30 LAB — ECHOCARDIOGRAM COMPLETE
Height: 71 in
Weight: 2303.37 oz

## 2017-06-30 LAB — HIV ANTIBODY (ROUTINE TESTING W REFLEX): HIV Screen 4th Generation wRfx: NONREACTIVE

## 2017-06-30 LAB — MRSA PCR SCREENING: MRSA by PCR: NEGATIVE

## 2017-06-30 MED ORDER — HYDRALAZINE HCL 20 MG/ML IJ SOLN
10.0000 mg | Freq: Four times a day (QID) | INTRAMUSCULAR | Status: DC | PRN
  Administered 2017-06-30: 10 mg via INTRAVENOUS
  Filled 2017-06-30: qty 1

## 2017-06-30 MED ORDER — METOPROLOL SUCCINATE ER 100 MG PO TB24: 100.0000 mg | ORAL_TABLET | Freq: Every day | ORAL | 3 refills | Status: AC

## 2017-06-30 MED ORDER — METOPROLOL TARTRATE 5 MG/5ML IV SOLN
5.0000 mg | Freq: Once | INTRAVENOUS | Status: DC
  Filled 2017-06-30: qty 5

## 2017-06-30 MED ORDER — APIXABAN 2.5 MG PO TABS: 2.5000 mg | ORAL_TABLET | Freq: Two times a day (BID) | ORAL | 2 refills | Status: AC

## 2017-06-30 NOTE — Progress Notes (Signed)
  Echocardiogram 2D Echocardiogram has been performed.  Jannett Celestine 06/30/2017, 9:11 AM

## 2017-06-30 NOTE — Discharge Summary (Signed)
Physician Discharge Summary   Patient ID: Samuel Sparks MRN: [id number] DOB/AGE: 1970-10-17 47 y.o.  Admit date: 06/29/2017 Discharge date: 06/30/2017  Primary Care Physician:  [facility]   Recommendations for Outpatient Follow-up:  1. Follow up with PCP in 1-2 weeks 2. Please obtain BMP/CBC in one week  Home Health: None  Equipment/Devices: None  Discharge Condition: stable CODE STATUS: FULL  Diet recommendation: Heart healthy   Discharge Diagnoses:    . Dyspnea . Atrial fibrillation ([place]) . Hypertensive urgency with volume overload  . ESRD (end stage renal disease) ([place]) . Hyperkalemia . Anemia of chronic disease   Consults: Nephrology    Allergies:  No Known Allergies   DISCHARGE MEDICATIONS: Allergies as of 06/30/2017   No Known Allergies     Medication List    TAKE these medications   acetaminophen 325 MG tablet Commonly known as:  TYLENOL Take 650 mg by mouth every 6 (six) hours as needed for mild pain.   amLODipine 5 MG tablet Commonly known as:  NORVASC Take 5 mg by mouth daily.   apixaban 2.5 MG Tabs tablet Commonly known as:  ELIQUIS Take 1 tablet (2.5 mg total) by mouth 2 (two) times daily.   diltiazem 240 MG 24 hr capsule Commonly known as:  CARDIZEM CD Take 240 mg by mouth daily.   hydrALAZINE 100 MG tablet Commonly known as:  APRESOLINE Take 100 mg by mouth every 8 (eight) hours.   isosorbide mononitrate 60 MG 24 hr tablet Commonly known as:  IMDUR Take 60 mg by mouth every evening.   lisinopril 40 MG tablet Commonly known as:  PRINIVIL,ZESTRIL Take 40 mg by mouth daily.   metoprolol succinate 100 MG 24 hr tablet Commonly known as:  TOPROL-XL Take 1 tablet (100 mg total) by mouth daily. Take with or immediately following a meal. Start taking on:  07/01/2017        Brief H and P: For complete details please refer to admission H and P, but in brief Samuel Sparks is a 47 y.o. male with a  Past Medical History of ESRD on HD; HTN; CVA; and afib on Eliquis who presents with respiratory distress.  He had an EF of f45-50% on echo in January, but had an echo on 4/30 at [facility] and at that time EF appeared to be 35-40% with NICM likely related to HTN heart disease.  He was discharged on 5/2; attended HD on 5/6; and return to the ER on 5/9  with apparent volume overload.  Patient underwent emergent dialysis,  Hospital Course:     Acute volume overload, diastolic CHF exacerbation, with underlying ESRD and uncontrolled hypertension  -Chest x-ray showed CHF with pulmonary interstitial edema, Patient was given IV doses of Lasix, nephrology was consulted and underwent-urgent hemodialysis.  Patient currently feels back to baseline. 2D echo showed EF of 55 to 09%, grade 1 diastolic dysfunction  Hypertension, uncontrolled with a hypertensive urgency -Patient was admitted to stepdown, started briefly on nitro drip, Lasix and underwent emergent hemodialysis -Restarted outpatient antihypertensives, also added Toprol-XL 100 mg daily   ESRD on hemodialysis, hyperkalemia -Nephrology was consulted, patient underwent hemodialysis on 5/8, feels back to baseline today  Atrial fibrillation, chronic -Continue metoprolol, Cardizem, Eliquis  Anemia of chronic disease Hemoglobin 10, no active bleeding-   Day of Discharge S: Denies any specific complaints, feels a lot better today.  BP (!) 139/93 (BP Location: Right Arm)   Pulse 82   Temp 98.2  F (36.8 C) (Oral)   Resp 18   Ht 5\' 11"  (1.803 m)   Wt 65.3 kg (143 lb 15.4 oz)   SpO2 98%   BMI 20.08 kg/m   Physical Exam: General: Alert and awake oriented x3 not in any acute distress. HEENT: anicteric sclera, pupils reactive to light and accommodation CVS: S1-S2 clear no murmur rubs or gallops Chest: clear to auscultation bilaterally, no wheezing rales or rhonchi Abdomen: soft nontender, nondistended, normal bowel sounds Extremities: no cyanosis,  clubbing or edema noted bilaterally Neuro: Cranial nerves II-XII intact, no focal neurological deficits   The results of significant diagnostics from this hospitalization (including imaging, microbiology, ancillary and laboratory) are listed below for reference.      Procedures/Studies:  Dg Chest Port 1 View  Result Date: 06/29/2017 CLINICAL DATA:  Shortness of breath and weakness EXAM: PORTABLE CHEST 1 VIEW COMPARISON:  Chest x-ray of June 21, 2015 FINDINGS: The lungs are adequately inflated. The interstitial markings are increased and there is near confluence of these markings in the perihilar and infrahilar regions. There is no pleural effusion. The cardiac silhouette is enlarged and the pulmonary vascularity engorged. There is mild curvature of the lower thoracic spine convex toward the right. IMPRESSION: CHF with pulmonary interstitial and alveolar edema. The appearance of the chest has not significantly improved since the earlier study. Electronically Signed   By: David  Martinique M.D.   On: 06/29/2017 10:23       LAB RESULTS: Basic Metabolic Panel: Recent Labs  Lab 06/29/17 0915 06/29/17 0937 06/29/17 1301 06/30/17 0405  NA 139 137  --  138  K 6.4* 6.3*  --  4.7  CL 102 104  --  99*  CO2 22  --   --  30  GLUCOSE 105* 102*  --  84  BUN 72* 60*  --  28*  CREATININE 11.09* 10.60* 11.47* 6.20*  CALCIUM 9.0  --   --  8.8*  MG 3.1*  --   --   --    Liver Function Tests: Recent Labs  Lab 06/29/17 0915  AST 22  ALT 25  ALKPHOS 77  BILITOT 0.5  PROT 6.3*  ALBUMIN 3.2*   No results for input(s): LIPASE, AMYLASE in the last 168 hours. No results for input(s): AMMONIA in the last 168 hours. CBC: Recent Labs  Lab 06/29/17 0915 06/29/17 0937 06/30/17 0405  WBC 9.9  --  6.9  HGB 10.2* 10.5* 9.6*  HCT 32.4* 31.0* 30.0*  MCV 94.5  --  93.8  PLT 209  --  212   Cardiac Enzymes: Recent Labs  Lab 06/29/17 1146  TROPONINI 0.06*   BNP: Invalid input(s):  POCBNP CBG: Recent Labs  Lab 06/29/17 2225  GLUCAP 78      Disposition and Follow-up: Discharge Instructions    Diet - low sodium heart healthy   Complete by:  As directed    Increase activity slowly   Complete by:  As directed        DISPOSITION: [place]. Schedule an appointment as soon as possible for a visit in 2 week(s).   Contact information: [place] [address] [phone]            Time coordinating discharge:  45 mins   Signed:   Estill Cotta M.D. [facility] 06/30/2017, 1:27 PM Pager: [phone]

## 2017-06-30 NOTE — Plan of Care (Signed)
  Problem: Education: Goal: Knowledge of General Education information will improve Outcome: Progressing   Problem: Health Behavior/Discharge Planning: Goal: Ability to manage health-related needs will improve Outcome: Progressing   Problem: Activity: Goal: Risk for activity intolerance will decrease Outcome: Progressing   Problem: Nutrition: Goal: Adequate nutrition will be maintained Outcome: Progressing   Problem: Elimination: Goal: Will not experience complications related to bowel motility Outcome: Progressing Goal: Will not experience complications related to urinary retention Outcome: Progressing   Problem: Pain Managment: Goal: General experience of comfort will improve Outcome: Progressing   Problem: Education: Goal: Ability to demonstrate management of disease process will improve Outcome: Progressing Goal: Ability to verbalize understanding of medication therapies will improve Outcome: Progressing   Problem: Activity: Goal: Capacity to carry out activities will improve Outcome: Progressing   Problem: Cardiac: Goal: Ability to achieve and maintain adequate cardiopulmonary perfusion will improve Outcome: Progressing

## 2017-06-30 NOTE — Progress Notes (Signed)
[facility] ROUNDING NOTE   Subjective:   Interval History: doing well this morning with no complaints  End stage renal disease  Developed dyspnea and cough this morning  Felt as though he was drowning  He is compliant although states that on Monday he had only a little fluid on. He started dialysis in December after a brief hiatus off dialysis     Objective:  Vital signs in last 24 hours:  Temp:  [97.4 F (36.3 C)-98.5 F (36.9 C)] 98.3 F (36.8 C) (05/09 0604) Pulse Rate:  [75-148] 86 (05/09 0530) Resp:  [16-31] 16 (05/09 0530) BP: (131-184)/(82-132) 162/105 (05/09 0530) SpO2:  [89 %-99 %] 98 % (05/09 0530) Weight:  [143 lb 15.4 oz (65.3 kg)-150 lb 12.7 oz (68.4 kg)] 143 lb 15.4 oz (65.3 kg) (05/08 2216)  Weight change:  Filed Weights   06/29/17 0930 06/29/17 1545 06/29/17 2216  Weight: 152 lb (68.9 kg) 150 lb 12.7 oz (68.4 kg) 143 lb 15.4 oz (65.3 kg)    Intake/Output: I/O last 3 completed shifts: In: 200 [P.O.:200] Out: 500 [Urine:500]   Intake/Output this shift:  Total I/O In: 240 [P.O.:240] Out: -   CVS- RRR RS- CTA ABD- BS present soft non-distended EXT- no edema   Basic Metabolic Panel: Recent Labs  Lab 06/29/17 0915 06/29/17 0937 06/29/17 1301 06/30/17 0405  NA 139 137  --  138  K 6.4* 6.3*  --  4.7  CL 102 104  --  99*  CO2 22  --   --  30  GLUCOSE 105* 102*  --  84  BUN 72* 60*  --  28*  CREATININE 11.09* 10.60* 11.47* 6.20*  CALCIUM 9.0  --   --  8.8*  MG 3.1*  --   --   --     Liver Function Tests: Recent Labs  Lab 06/29/17 0915  AST 22  ALT 25  ALKPHOS 77  BILITOT 0.5  PROT 6.3*  ALBUMIN 3.2*   No results for input(s): LIPASE, AMYLASE in the last 168 hours. No results for input(s): AMMONIA in the last 168 hours.  CBC: Recent Labs  Lab 06/29/17 0915 06/29/17 0937 06/30/17 0405  WBC 9.9  --  6.9  HGB 10.2* 10.5* 9.6*  HCT 32.4* 31.0* 30.0*  MCV 94.5  --  93.8  PLT 209  --  212    Cardiac  Enzymes: Recent Labs  Lab 06/29/17 1146  TROPONINI 0.06*    BNP: Invalid input(s): POCBNP  CBG: Recent Labs  Lab 06/29/17 9622  WLNLGX 21    Microbiology: Results for orders placed or performed during the hospital encounter of 06/29/17  MRSA PCR Screening     Status: None   Collection Time: 06/29/17 10:06 PM  Result Value Ref Range Status   MRSA by PCR NEGATIVE NEGATIVE Final    Comment:        The GeneXpert MRSA Assay (FDA approved for NASAL specimens only), is one component of a comprehensive MRSA colonization surveillance program. It is not intended to diagnose MRSA infection nor to guide or monitor treatment for MRSA infections. Performed at [facility], [place] [address], [place], [place] [address]     Coagulation Studies: No results for input(s): LABPROT, INR in the last 72 hours.  Urinalysis: No results for input(s): COLORURINE, LABSPEC, PHURINE, GLUCOSEU, HGBUR, BILIRUBINUR, KETONESUR, PROTEINUR, UROBILINOGEN, NITRITE, LEUKOCYTESUR in the last 72 hours.  Invalid input(s): APPERANCEUR    Imaging: Dg Chest [facility] 1 [address]  Result Date: 06/29/2017 CLINICAL DATA:  Shortness of breath and weakness EXAM: PORTABLE CHEST 1 VIEW COMPARISON:  Chest x-ray of June 21, 2015 FINDINGS: The lungs are adequately inflated. The interstitial markings are increased and there is near confluence of these markings in the perihilar and infrahilar regions. There is no pleural effusion. The cardiac silhouette is enlarged and the pulmonary vascularity engorged. There is mild curvature of the lower thoracic spine convex toward the right. IMPRESSION: CHF with pulmonary interstitial and alveolar edema. The appearance of the chest has not significantly improved since the earlier study. Electronically Signed   By: David  Martinique M.D.   On: 06/29/2017 10:23     Medications:   . sodium chloride     . amLODipine  5 mg Oral Daily  . apixaban  2.5 mg Oral BID  . diltiazem  240 mg Oral  Daily  . furosemide  40 mg Intravenous BID  . hydrALAZINE  100 mg Oral Q8H  . metoprolol succinate  100 mg Oral Daily  . metoprolol tartrate  5 mg Intravenous Once  . sodium chloride flush  3 mL Intravenous Q12H   sodium chloride, acetaminophen, hydrALAZINE, ondansetron (ZOFRAN) IV, sodium chloride flush  Assessment/ Plan:   ESRD- MWF dialysis  Stable for discharge     ANEMIA- stable greater than 10  No esa needed at the present time  MBD- stable  HTN/VOL- volume overload    [facility] EDW   ACCESS-  AVF  No issues         LOS: 0 [place] Daley W @TODAY @12 :02 PM

## 2017-07-01 DIAGNOSIS — N186 End stage renal disease: Secondary | ICD-10-CM | POA: Diagnosis not present

## 2017-07-01 DIAGNOSIS — D509 Iron deficiency anemia, unspecified: Secondary | ICD-10-CM | POA: Diagnosis not present

## 2017-07-01 DIAGNOSIS — N2581 Secondary hyperparathyroidism of renal origin: Secondary | ICD-10-CM | POA: Diagnosis not present

## 2017-07-01 DIAGNOSIS — I10 Essential (primary) hypertension: Secondary | ICD-10-CM | POA: Diagnosis not present

## 2017-07-01 DIAGNOSIS — D631 Anemia in chronic kidney disease: Secondary | ICD-10-CM | POA: Diagnosis not present

## 2017-07-04 DIAGNOSIS — I10 Essential (primary) hypertension: Secondary | ICD-10-CM | POA: Diagnosis not present

## 2017-07-04 DIAGNOSIS — N186 End stage renal disease: Secondary | ICD-10-CM | POA: Diagnosis not present

## 2017-07-04 DIAGNOSIS — N2581 Secondary hyperparathyroidism of renal origin: Secondary | ICD-10-CM | POA: Diagnosis not present

## 2017-07-04 DIAGNOSIS — D509 Iron deficiency anemia, unspecified: Secondary | ICD-10-CM | POA: Diagnosis not present

## 2017-07-04 DIAGNOSIS — D631 Anemia in chronic kidney disease: Secondary | ICD-10-CM | POA: Diagnosis not present

## 2017-07-06 DIAGNOSIS — N186 End stage renal disease: Secondary | ICD-10-CM | POA: Diagnosis not present

## 2017-07-06 DIAGNOSIS — D631 Anemia in chronic kidney disease: Secondary | ICD-10-CM | POA: Diagnosis not present

## 2017-07-06 DIAGNOSIS — D509 Iron deficiency anemia, unspecified: Secondary | ICD-10-CM | POA: Diagnosis not present

## 2017-07-06 DIAGNOSIS — N2581 Secondary hyperparathyroidism of renal origin: Secondary | ICD-10-CM | POA: Diagnosis not present

## 2017-07-06 DIAGNOSIS — I10 Essential (primary) hypertension: Secondary | ICD-10-CM | POA: Diagnosis not present

## 2017-07-08 DIAGNOSIS — N186 End stage renal disease: Secondary | ICD-10-CM | POA: Diagnosis not present

## 2017-07-08 DIAGNOSIS — D631 Anemia in chronic kidney disease: Secondary | ICD-10-CM | POA: Diagnosis not present

## 2017-07-08 DIAGNOSIS — N2581 Secondary hyperparathyroidism of renal origin: Secondary | ICD-10-CM | POA: Diagnosis not present

## 2017-07-08 DIAGNOSIS — D509 Iron deficiency anemia, unspecified: Secondary | ICD-10-CM | POA: Diagnosis not present

## 2017-07-08 DIAGNOSIS — I10 Essential (primary) hypertension: Secondary | ICD-10-CM | POA: Diagnosis not present

## 2017-07-11 DIAGNOSIS — D631 Anemia in chronic kidney disease: Secondary | ICD-10-CM | POA: Diagnosis not present

## 2017-07-11 DIAGNOSIS — I10 Essential (primary) hypertension: Secondary | ICD-10-CM | POA: Diagnosis not present

## 2017-07-11 DIAGNOSIS — N2581 Secondary hyperparathyroidism of renal origin: Secondary | ICD-10-CM | POA: Diagnosis not present

## 2017-07-11 DIAGNOSIS — D509 Iron deficiency anemia, unspecified: Secondary | ICD-10-CM | POA: Diagnosis not present

## 2017-07-11 DIAGNOSIS — N186 End stage renal disease: Secondary | ICD-10-CM | POA: Diagnosis not present

## 2017-07-12 DIAGNOSIS — Z992 Dependence on renal dialysis: Secondary | ICD-10-CM | POA: Diagnosis not present

## 2017-07-12 DIAGNOSIS — N186 End stage renal disease: Secondary | ICD-10-CM | POA: Diagnosis not present

## 2017-07-12 DIAGNOSIS — I480 Paroxysmal atrial fibrillation: Secondary | ICD-10-CM | POA: Diagnosis not present

## 2017-07-12 DIAGNOSIS — Z01818 Encounter for other preprocedural examination: Secondary | ICD-10-CM | POA: Diagnosis not present

## 2017-07-12 DIAGNOSIS — I150 Renovascular hypertension: Secondary | ICD-10-CM | POA: Diagnosis not present

## 2017-07-13 DIAGNOSIS — I10 Essential (primary) hypertension: Secondary | ICD-10-CM | POA: Diagnosis not present

## 2017-07-13 DIAGNOSIS — N186 End stage renal disease: Secondary | ICD-10-CM | POA: Diagnosis not present

## 2017-07-13 DIAGNOSIS — D631 Anemia in chronic kidney disease: Secondary | ICD-10-CM | POA: Diagnosis not present

## 2017-07-13 DIAGNOSIS — N2581 Secondary hyperparathyroidism of renal origin: Secondary | ICD-10-CM | POA: Diagnosis not present

## 2017-07-13 DIAGNOSIS — D509 Iron deficiency anemia, unspecified: Secondary | ICD-10-CM | POA: Diagnosis not present

## 2017-07-15 DIAGNOSIS — N2581 Secondary hyperparathyroidism of renal origin: Secondary | ICD-10-CM | POA: Diagnosis not present

## 2017-07-15 DIAGNOSIS — N186 End stage renal disease: Secondary | ICD-10-CM | POA: Diagnosis not present

## 2017-07-15 DIAGNOSIS — D509 Iron deficiency anemia, unspecified: Secondary | ICD-10-CM | POA: Diagnosis not present

## 2017-07-15 DIAGNOSIS — D631 Anemia in chronic kidney disease: Secondary | ICD-10-CM | POA: Diagnosis not present

## 2017-07-15 DIAGNOSIS — I10 Essential (primary) hypertension: Secondary | ICD-10-CM | POA: Diagnosis not present

## 2017-07-18 DIAGNOSIS — N2581 Secondary hyperparathyroidism of renal origin: Secondary | ICD-10-CM | POA: Diagnosis not present

## 2017-07-18 DIAGNOSIS — D631 Anemia in chronic kidney disease: Secondary | ICD-10-CM | POA: Diagnosis not present

## 2017-07-18 DIAGNOSIS — N186 End stage renal disease: Secondary | ICD-10-CM | POA: Diagnosis not present

## 2017-07-18 DIAGNOSIS — D509 Iron deficiency anemia, unspecified: Secondary | ICD-10-CM | POA: Diagnosis not present

## 2017-07-18 DIAGNOSIS — I10 Essential (primary) hypertension: Secondary | ICD-10-CM | POA: Diagnosis not present

## 2017-07-20 DIAGNOSIS — D509 Iron deficiency anemia, unspecified: Secondary | ICD-10-CM | POA: Diagnosis not present

## 2017-07-20 DIAGNOSIS — D631 Anemia in chronic kidney disease: Secondary | ICD-10-CM | POA: Diagnosis not present

## 2017-07-20 DIAGNOSIS — N186 End stage renal disease: Secondary | ICD-10-CM | POA: Diagnosis not present

## 2017-07-20 DIAGNOSIS — N2581 Secondary hyperparathyroidism of renal origin: Secondary | ICD-10-CM | POA: Diagnosis not present

## 2017-07-20 DIAGNOSIS — I10 Essential (primary) hypertension: Secondary | ICD-10-CM | POA: Diagnosis not present

## 2017-07-22 DIAGNOSIS — I10 Essential (primary) hypertension: Secondary | ICD-10-CM | POA: Diagnosis not present

## 2017-07-22 DIAGNOSIS — D631 Anemia in chronic kidney disease: Secondary | ICD-10-CM | POA: Diagnosis not present

## 2017-07-22 DIAGNOSIS — N186 End stage renal disease: Secondary | ICD-10-CM | POA: Diagnosis not present

## 2017-07-22 DIAGNOSIS — Z992 Dependence on renal dialysis: Secondary | ICD-10-CM | POA: Diagnosis not present

## 2017-07-22 DIAGNOSIS — D509 Iron deficiency anemia, unspecified: Secondary | ICD-10-CM | POA: Diagnosis not present

## 2017-07-22 DIAGNOSIS — N2581 Secondary hyperparathyroidism of renal origin: Secondary | ICD-10-CM | POA: Diagnosis not present

## 2017-07-25 DIAGNOSIS — D631 Anemia in chronic kidney disease: Secondary | ICD-10-CM | POA: Diagnosis not present

## 2017-07-25 DIAGNOSIS — I10 Essential (primary) hypertension: Secondary | ICD-10-CM | POA: Diagnosis not present

## 2017-07-25 DIAGNOSIS — D509 Iron deficiency anemia, unspecified: Secondary | ICD-10-CM | POA: Diagnosis not present

## 2017-07-25 DIAGNOSIS — N2581 Secondary hyperparathyroidism of renal origin: Secondary | ICD-10-CM | POA: Diagnosis not present

## 2017-07-25 DIAGNOSIS — N186 End stage renal disease: Secondary | ICD-10-CM | POA: Diagnosis not present

## 2017-07-27 DIAGNOSIS — I129 Hypertensive chronic kidney disease with stage 1 through stage 4 chronic kidney disease, or unspecified chronic kidney disease: Secondary | ICD-10-CM | POA: Diagnosis not present

## 2017-07-27 DIAGNOSIS — D500 Iron deficiency anemia secondary to blood loss (chronic): Secondary | ICD-10-CM | POA: Diagnosis not present

## 2017-07-27 DIAGNOSIS — I10 Essential (primary) hypertension: Secondary | ICD-10-CM | POA: Diagnosis not present

## 2017-07-27 DIAGNOSIS — D631 Anemia in chronic kidney disease: Secondary | ICD-10-CM | POA: Diagnosis not present

## 2017-07-27 DIAGNOSIS — N186 End stage renal disease: Secondary | ICD-10-CM | POA: Diagnosis not present

## 2017-07-27 DIAGNOSIS — I499 Cardiac arrhythmia, unspecified: Secondary | ICD-10-CM | POA: Diagnosis not present

## 2017-07-27 DIAGNOSIS — N189 Chronic kidney disease, unspecified: Secondary | ICD-10-CM | POA: Diagnosis not present

## 2017-07-27 DIAGNOSIS — D509 Iron deficiency anemia, unspecified: Secondary | ICD-10-CM | POA: Diagnosis not present

## 2017-07-27 DIAGNOSIS — N2581 Secondary hyperparathyroidism of renal origin: Secondary | ICD-10-CM | POA: Diagnosis not present

## 2017-07-27 DIAGNOSIS — R002 Palpitations: Secondary | ICD-10-CM | POA: Diagnosis not present

## 2017-07-27 DIAGNOSIS — I480 Paroxysmal atrial fibrillation: Secondary | ICD-10-CM | POA: Diagnosis not present

## 2017-07-29 DIAGNOSIS — D631 Anemia in chronic kidney disease: Secondary | ICD-10-CM | POA: Diagnosis not present

## 2017-07-29 DIAGNOSIS — N186 End stage renal disease: Secondary | ICD-10-CM | POA: Diagnosis not present

## 2017-07-29 DIAGNOSIS — N2581 Secondary hyperparathyroidism of renal origin: Secondary | ICD-10-CM | POA: Diagnosis not present

## 2017-07-29 DIAGNOSIS — D509 Iron deficiency anemia, unspecified: Secondary | ICD-10-CM | POA: Diagnosis not present

## 2017-07-29 DIAGNOSIS — I10 Essential (primary) hypertension: Secondary | ICD-10-CM | POA: Diagnosis not present

## 2017-07-31 DIAGNOSIS — E875 Hyperkalemia: Secondary | ICD-10-CM | POA: Diagnosis not present

## 2017-07-31 DIAGNOSIS — I2510 Atherosclerotic heart disease of native coronary artery without angina pectoris: Secondary | ICD-10-CM | POA: Diagnosis not present

## 2017-07-31 DIAGNOSIS — R0602 Shortness of breath: Secondary | ICD-10-CM | POA: Diagnosis not present

## 2017-07-31 DIAGNOSIS — Z8673 Personal history of transient ischemic attack (TIA), and cerebral infarction without residual deficits: Secondary | ICD-10-CM | POA: Diagnosis not present

## 2017-07-31 DIAGNOSIS — I132 Hypertensive heart and chronic kidney disease with heart failure and with stage 5 chronic kidney disease, or end stage renal disease: Secondary | ICD-10-CM | POA: Diagnosis not present

## 2017-07-31 DIAGNOSIS — R0902 Hypoxemia: Secondary | ICD-10-CM | POA: Diagnosis present

## 2017-07-31 DIAGNOSIS — Z9119 Patient's noncompliance with other medical treatment and regimen: Secondary | ICD-10-CM | POA: Diagnosis not present

## 2017-07-31 DIAGNOSIS — E8770 Fluid overload, unspecified: Secondary | ICD-10-CM | POA: Diagnosis not present

## 2017-07-31 DIAGNOSIS — Z8249 Family history of ischemic heart disease and other diseases of the circulatory system: Secondary | ICD-10-CM | POA: Diagnosis not present

## 2017-07-31 DIAGNOSIS — Z9981 Dependence on supplemental oxygen: Secondary | ICD-10-CM | POA: Diagnosis not present

## 2017-07-31 DIAGNOSIS — Z833 Family history of diabetes mellitus: Secondary | ICD-10-CM | POA: Diagnosis not present

## 2017-07-31 DIAGNOSIS — I160 Hypertensive urgency: Secondary | ICD-10-CM | POA: Diagnosis not present

## 2017-07-31 DIAGNOSIS — I150 Renovascular hypertension: Secondary | ICD-10-CM | POA: Diagnosis not present

## 2017-07-31 DIAGNOSIS — I482 Chronic atrial fibrillation: Secondary | ICD-10-CM | POA: Diagnosis not present

## 2017-07-31 DIAGNOSIS — I120 Hypertensive chronic kidney disease with stage 5 chronic kidney disease or end stage renal disease: Secondary | ICD-10-CM | POA: Diagnosis not present

## 2017-07-31 DIAGNOSIS — R5383 Other fatigue: Secondary | ICD-10-CM | POA: Diagnosis not present

## 2017-07-31 DIAGNOSIS — I5023 Acute on chronic systolic (congestive) heart failure: Secondary | ICD-10-CM | POA: Diagnosis not present

## 2017-07-31 DIAGNOSIS — I5022 Chronic systolic (congestive) heart failure: Secondary | ICD-10-CM | POA: Diagnosis not present

## 2017-07-31 DIAGNOSIS — N186 End stage renal disease: Secondary | ICD-10-CM | POA: Diagnosis not present

## 2017-07-31 DIAGNOSIS — R000 Tachycardia, unspecified: Secondary | ICD-10-CM | POA: Diagnosis not present

## 2017-07-31 DIAGNOSIS — D509 Iron deficiency anemia, unspecified: Secondary | ICD-10-CM | POA: Diagnosis present

## 2017-07-31 DIAGNOSIS — R9431 Abnormal electrocardiogram [ECG] [EKG]: Secondary | ICD-10-CM | POA: Diagnosis not present

## 2017-07-31 DIAGNOSIS — I480 Paroxysmal atrial fibrillation: Secondary | ICD-10-CM | POA: Diagnosis not present

## 2017-07-31 DIAGNOSIS — D631 Anemia in chronic kidney disease: Secondary | ICD-10-CM | POA: Diagnosis not present

## 2017-07-31 DIAGNOSIS — I428 Other cardiomyopathies: Secondary | ICD-10-CM | POA: Diagnosis not present

## 2017-07-31 DIAGNOSIS — F39 Unspecified mood [affective] disorder: Secondary | ICD-10-CM | POA: Diagnosis present

## 2017-07-31 DIAGNOSIS — Z7901 Long term (current) use of anticoagulants: Secondary | ICD-10-CM | POA: Diagnosis not present

## 2017-07-31 DIAGNOSIS — R079 Chest pain, unspecified: Secondary | ICD-10-CM | POA: Diagnosis not present

## 2017-07-31 DIAGNOSIS — E213 Hyperparathyroidism, unspecified: Secondary | ICD-10-CM | POA: Diagnosis present

## 2017-07-31 DIAGNOSIS — I429 Cardiomyopathy, unspecified: Secondary | ICD-10-CM | POA: Diagnosis not present

## 2017-07-31 DIAGNOSIS — I5020 Unspecified systolic (congestive) heart failure: Secondary | ICD-10-CM | POA: Diagnosis not present

## 2017-07-31 DIAGNOSIS — J9601 Acute respiratory failure with hypoxia: Secondary | ICD-10-CM | POA: Diagnosis not present

## 2017-07-31 DIAGNOSIS — Z992 Dependence on renal dialysis: Secondary | ICD-10-CM | POA: Diagnosis not present

## 2017-07-31 DIAGNOSIS — R7611 Nonspecific reaction to tuberculin skin test without active tuberculosis: Secondary | ICD-10-CM | POA: Diagnosis present

## 2017-08-03 DIAGNOSIS — N186 End stage renal disease: Secondary | ICD-10-CM | POA: Diagnosis not present

## 2017-08-03 DIAGNOSIS — D631 Anemia in chronic kidney disease: Secondary | ICD-10-CM | POA: Diagnosis not present

## 2017-08-03 DIAGNOSIS — I10 Essential (primary) hypertension: Secondary | ICD-10-CM | POA: Diagnosis not present

## 2017-08-03 DIAGNOSIS — D509 Iron deficiency anemia, unspecified: Secondary | ICD-10-CM | POA: Diagnosis not present

## 2017-08-03 DIAGNOSIS — N2581 Secondary hyperparathyroidism of renal origin: Secondary | ICD-10-CM | POA: Diagnosis not present

## 2017-08-05 DIAGNOSIS — I10 Essential (primary) hypertension: Secondary | ICD-10-CM | POA: Diagnosis not present

## 2017-08-05 DIAGNOSIS — N186 End stage renal disease: Secondary | ICD-10-CM | POA: Diagnosis not present

## 2017-08-05 DIAGNOSIS — D631 Anemia in chronic kidney disease: Secondary | ICD-10-CM | POA: Diagnosis not present

## 2017-08-05 DIAGNOSIS — N2581 Secondary hyperparathyroidism of renal origin: Secondary | ICD-10-CM | POA: Diagnosis not present

## 2017-08-05 DIAGNOSIS — D509 Iron deficiency anemia, unspecified: Secondary | ICD-10-CM | POA: Diagnosis not present

## 2017-08-08 DIAGNOSIS — I10 Essential (primary) hypertension: Secondary | ICD-10-CM | POA: Diagnosis not present

## 2017-08-08 DIAGNOSIS — D509 Iron deficiency anemia, unspecified: Secondary | ICD-10-CM | POA: Diagnosis not present

## 2017-08-08 DIAGNOSIS — D631 Anemia in chronic kidney disease: Secondary | ICD-10-CM | POA: Diagnosis not present

## 2017-08-08 DIAGNOSIS — N186 End stage renal disease: Secondary | ICD-10-CM | POA: Diagnosis not present

## 2017-08-08 DIAGNOSIS — N2581 Secondary hyperparathyroidism of renal origin: Secondary | ICD-10-CM | POA: Diagnosis not present

## 2017-08-10 DIAGNOSIS — I10 Essential (primary) hypertension: Secondary | ICD-10-CM | POA: Diagnosis not present

## 2017-08-10 DIAGNOSIS — D509 Iron deficiency anemia, unspecified: Secondary | ICD-10-CM | POA: Diagnosis not present

## 2017-08-10 DIAGNOSIS — N186 End stage renal disease: Secondary | ICD-10-CM | POA: Diagnosis not present

## 2017-08-10 DIAGNOSIS — D631 Anemia in chronic kidney disease: Secondary | ICD-10-CM | POA: Diagnosis not present

## 2017-08-10 DIAGNOSIS — N2581 Secondary hyperparathyroidism of renal origin: Secondary | ICD-10-CM | POA: Diagnosis not present

## 2017-08-12 DIAGNOSIS — D509 Iron deficiency anemia, unspecified: Secondary | ICD-10-CM | POA: Diagnosis not present

## 2017-08-12 DIAGNOSIS — N2581 Secondary hyperparathyroidism of renal origin: Secondary | ICD-10-CM | POA: Diagnosis not present

## 2017-08-12 DIAGNOSIS — N186 End stage renal disease: Secondary | ICD-10-CM | POA: Diagnosis not present

## 2017-08-12 DIAGNOSIS — D631 Anemia in chronic kidney disease: Secondary | ICD-10-CM | POA: Diagnosis not present

## 2017-08-12 DIAGNOSIS — I10 Essential (primary) hypertension: Secondary | ICD-10-CM | POA: Diagnosis not present

## 2017-08-15 DIAGNOSIS — D509 Iron deficiency anemia, unspecified: Secondary | ICD-10-CM | POA: Diagnosis not present

## 2017-08-15 DIAGNOSIS — D631 Anemia in chronic kidney disease: Secondary | ICD-10-CM | POA: Diagnosis not present

## 2017-08-15 DIAGNOSIS — I10 Essential (primary) hypertension: Secondary | ICD-10-CM | POA: Diagnosis not present

## 2017-08-15 DIAGNOSIS — N186 End stage renal disease: Secondary | ICD-10-CM | POA: Diagnosis not present

## 2017-08-15 DIAGNOSIS — N2581 Secondary hyperparathyroidism of renal origin: Secondary | ICD-10-CM | POA: Diagnosis not present

## 2017-08-17 DIAGNOSIS — I10 Essential (primary) hypertension: Secondary | ICD-10-CM | POA: Diagnosis not present

## 2017-08-17 DIAGNOSIS — D509 Iron deficiency anemia, unspecified: Secondary | ICD-10-CM | POA: Diagnosis not present

## 2017-08-17 DIAGNOSIS — N2581 Secondary hyperparathyroidism of renal origin: Secondary | ICD-10-CM | POA: Diagnosis not present

## 2017-08-17 DIAGNOSIS — D631 Anemia in chronic kidney disease: Secondary | ICD-10-CM | POA: Diagnosis not present

## 2017-08-17 DIAGNOSIS — N186 End stage renal disease: Secondary | ICD-10-CM | POA: Diagnosis not present

## 2017-08-19 DIAGNOSIS — N186 End stage renal disease: Secondary | ICD-10-CM | POA: Diagnosis not present

## 2017-08-19 DIAGNOSIS — D509 Iron deficiency anemia, unspecified: Secondary | ICD-10-CM | POA: Diagnosis not present

## 2017-08-19 DIAGNOSIS — N2581 Secondary hyperparathyroidism of renal origin: Secondary | ICD-10-CM | POA: Diagnosis not present

## 2017-08-19 DIAGNOSIS — I10 Essential (primary) hypertension: Secondary | ICD-10-CM | POA: Diagnosis not present

## 2017-08-19 DIAGNOSIS — D631 Anemia in chronic kidney disease: Secondary | ICD-10-CM | POA: Diagnosis not present

## 2017-08-21 DIAGNOSIS — N186 End stage renal disease: Secondary | ICD-10-CM | POA: Diagnosis not present

## 2017-08-21 DIAGNOSIS — Z992 Dependence on renal dialysis: Secondary | ICD-10-CM | POA: Diagnosis not present

## 2017-08-22 DIAGNOSIS — D631 Anemia in chronic kidney disease: Secondary | ICD-10-CM | POA: Diagnosis not present

## 2017-08-22 DIAGNOSIS — N2581 Secondary hyperparathyroidism of renal origin: Secondary | ICD-10-CM | POA: Diagnosis not present

## 2017-08-22 DIAGNOSIS — E785 Hyperlipidemia, unspecified: Secondary | ICD-10-CM | POA: Diagnosis not present

## 2017-08-22 DIAGNOSIS — N186 End stage renal disease: Secondary | ICD-10-CM | POA: Diagnosis not present

## 2017-08-24 DIAGNOSIS — E119 Type 2 diabetes mellitus without complications: Secondary | ICD-10-CM | POA: Diagnosis not present

## 2017-08-24 DIAGNOSIS — I10 Essential (primary) hypertension: Secondary | ICD-10-CM | POA: Diagnosis not present

## 2017-08-24 DIAGNOSIS — E785 Hyperlipidemia, unspecified: Secondary | ICD-10-CM | POA: Diagnosis not present

## 2017-08-24 DIAGNOSIS — N2581 Secondary hyperparathyroidism of renal origin: Secondary | ICD-10-CM | POA: Diagnosis not present

## 2017-08-24 DIAGNOSIS — D631 Anemia in chronic kidney disease: Secondary | ICD-10-CM | POA: Diagnosis not present

## 2017-08-24 DIAGNOSIS — N186 End stage renal disease: Secondary | ICD-10-CM | POA: Diagnosis not present

## 2017-08-26 DIAGNOSIS — N186 End stage renal disease: Secondary | ICD-10-CM | POA: Diagnosis not present

## 2017-08-26 DIAGNOSIS — N2581 Secondary hyperparathyroidism of renal origin: Secondary | ICD-10-CM | POA: Diagnosis not present

## 2017-08-26 DIAGNOSIS — D631 Anemia in chronic kidney disease: Secondary | ICD-10-CM | POA: Diagnosis not present

## 2017-08-26 DIAGNOSIS — E785 Hyperlipidemia, unspecified: Secondary | ICD-10-CM | POA: Diagnosis not present

## 2017-08-29 DIAGNOSIS — Z7901 Long term (current) use of anticoagulants: Secondary | ICD-10-CM | POA: Diagnosis not present

## 2017-08-29 DIAGNOSIS — I120 Hypertensive chronic kidney disease with stage 5 chronic kidney disease or end stage renal disease: Secondary | ICD-10-CM | POA: Diagnosis not present

## 2017-08-29 DIAGNOSIS — I480 Paroxysmal atrial fibrillation: Secondary | ICD-10-CM | POA: Diagnosis not present

## 2017-08-29 DIAGNOSIS — D638 Anemia in other chronic diseases classified elsewhere: Secondary | ICD-10-CM | POA: Diagnosis not present

## 2017-08-29 DIAGNOSIS — D631 Anemia in chronic kidney disease: Secondary | ICD-10-CM | POA: Diagnosis not present

## 2017-08-29 DIAGNOSIS — N186 End stage renal disease: Secondary | ICD-10-CM | POA: Diagnosis not present

## 2017-08-29 DIAGNOSIS — Z833 Family history of diabetes mellitus: Secondary | ICD-10-CM | POA: Diagnosis not present

## 2017-08-29 DIAGNOSIS — I482 Chronic atrial fibrillation: Secondary | ICD-10-CM | POA: Diagnosis not present

## 2017-08-29 DIAGNOSIS — I447 Left bundle-branch block, unspecified: Secondary | ICD-10-CM | POA: Diagnosis not present

## 2017-08-29 DIAGNOSIS — I440 Atrioventricular block, first degree: Secondary | ICD-10-CM | POA: Diagnosis not present

## 2017-08-29 DIAGNOSIS — Z8249 Family history of ischemic heart disease and other diseases of the circulatory system: Secondary | ICD-10-CM | POA: Diagnosis not present

## 2017-08-29 DIAGNOSIS — E889 Metabolic disorder, unspecified: Secondary | ICD-10-CM | POA: Diagnosis not present

## 2017-08-29 DIAGNOSIS — I5023 Acute on chronic systolic (congestive) heart failure: Secondary | ICD-10-CM | POA: Diagnosis not present

## 2017-08-29 DIAGNOSIS — I4891 Unspecified atrial fibrillation: Secondary | ICD-10-CM | POA: Diagnosis not present

## 2017-08-29 DIAGNOSIS — Z79899 Other long term (current) drug therapy: Secondary | ICD-10-CM | POA: Diagnosis not present

## 2017-08-29 DIAGNOSIS — E875 Hyperkalemia: Secondary | ICD-10-CM | POA: Diagnosis not present

## 2017-08-29 DIAGNOSIS — R531 Weakness: Secondary | ICD-10-CM | POA: Diagnosis not present

## 2017-08-29 DIAGNOSIS — I150 Renovascular hypertension: Secondary | ICD-10-CM | POA: Diagnosis not present

## 2017-08-29 DIAGNOSIS — Z992 Dependence on renal dialysis: Secondary | ICD-10-CM | POA: Diagnosis not present

## 2017-08-29 DIAGNOSIS — Z8673 Personal history of transient ischemic attack (TIA), and cerebral infarction without residual deficits: Secondary | ICD-10-CM | POA: Diagnosis not present

## 2017-08-29 DIAGNOSIS — I132 Hypertensive heart and chronic kidney disease with heart failure and with stage 5 chronic kidney disease, or end stage renal disease: Secondary | ICD-10-CM | POA: Diagnosis not present

## 2017-08-29 DIAGNOSIS — I5022 Chronic systolic (congestive) heart failure: Secondary | ICD-10-CM | POA: Diagnosis not present

## 2017-08-29 DIAGNOSIS — R5383 Other fatigue: Secondary | ICD-10-CM | POA: Diagnosis not present

## 2017-09-02 DIAGNOSIS — E785 Hyperlipidemia, unspecified: Secondary | ICD-10-CM | POA: Diagnosis not present

## 2017-09-02 DIAGNOSIS — N2581 Secondary hyperparathyroidism of renal origin: Secondary | ICD-10-CM | POA: Diagnosis not present

## 2017-09-02 DIAGNOSIS — N186 End stage renal disease: Secondary | ICD-10-CM | POA: Diagnosis not present

## 2017-09-02 DIAGNOSIS — D631 Anemia in chronic kidney disease: Secondary | ICD-10-CM | POA: Diagnosis not present

## 2017-09-05 DIAGNOSIS — E785 Hyperlipidemia, unspecified: Secondary | ICD-10-CM | POA: Diagnosis not present

## 2017-09-05 DIAGNOSIS — Z7901 Long term (current) use of anticoagulants: Secondary | ICD-10-CM | POA: Diagnosis not present

## 2017-09-05 DIAGNOSIS — I5022 Chronic systolic (congestive) heart failure: Secondary | ICD-10-CM | POA: Diagnosis not present

## 2017-09-05 DIAGNOSIS — N2581 Secondary hyperparathyroidism of renal origin: Secondary | ICD-10-CM | POA: Diagnosis not present

## 2017-09-05 DIAGNOSIS — I132 Hypertensive heart and chronic kidney disease with heart failure and with stage 5 chronic kidney disease, or end stage renal disease: Secondary | ICD-10-CM | POA: Diagnosis not present

## 2017-09-05 DIAGNOSIS — D631 Anemia in chronic kidney disease: Secondary | ICD-10-CM | POA: Diagnosis not present

## 2017-09-05 DIAGNOSIS — I480 Paroxysmal atrial fibrillation: Secondary | ICD-10-CM | POA: Diagnosis not present

## 2017-09-05 DIAGNOSIS — I471 Supraventricular tachycardia: Secondary | ICD-10-CM | POA: Diagnosis not present

## 2017-09-05 DIAGNOSIS — N186 End stage renal disease: Secondary | ICD-10-CM | POA: Diagnosis not present

## 2017-09-05 DIAGNOSIS — Z992 Dependence on renal dialysis: Secondary | ICD-10-CM | POA: Diagnosis not present

## 2017-09-07 DIAGNOSIS — N186 End stage renal disease: Secondary | ICD-10-CM | POA: Diagnosis not present

## 2017-09-07 DIAGNOSIS — N2581 Secondary hyperparathyroidism of renal origin: Secondary | ICD-10-CM | POA: Diagnosis not present

## 2017-09-07 DIAGNOSIS — D631 Anemia in chronic kidney disease: Secondary | ICD-10-CM | POA: Diagnosis not present

## 2017-09-07 DIAGNOSIS — E785 Hyperlipidemia, unspecified: Secondary | ICD-10-CM | POA: Diagnosis not present

## 2017-09-09 DIAGNOSIS — E785 Hyperlipidemia, unspecified: Secondary | ICD-10-CM | POA: Diagnosis not present

## 2017-09-09 DIAGNOSIS — N2581 Secondary hyperparathyroidism of renal origin: Secondary | ICD-10-CM | POA: Diagnosis not present

## 2017-09-09 DIAGNOSIS — N186 End stage renal disease: Secondary | ICD-10-CM | POA: Diagnosis not present

## 2017-09-09 DIAGNOSIS — D631 Anemia in chronic kidney disease: Secondary | ICD-10-CM | POA: Diagnosis not present

## 2017-09-12 DIAGNOSIS — N186 End stage renal disease: Secondary | ICD-10-CM | POA: Diagnosis not present

## 2017-09-12 DIAGNOSIS — D631 Anemia in chronic kidney disease: Secondary | ICD-10-CM | POA: Diagnosis not present

## 2017-09-12 DIAGNOSIS — E785 Hyperlipidemia, unspecified: Secondary | ICD-10-CM | POA: Diagnosis not present

## 2017-09-12 DIAGNOSIS — N2581 Secondary hyperparathyroidism of renal origin: Secondary | ICD-10-CM | POA: Diagnosis not present

## 2017-09-14 DIAGNOSIS — N2581 Secondary hyperparathyroidism of renal origin: Secondary | ICD-10-CM | POA: Diagnosis not present

## 2017-09-14 DIAGNOSIS — D631 Anemia in chronic kidney disease: Secondary | ICD-10-CM | POA: Diagnosis not present

## 2017-09-14 DIAGNOSIS — E785 Hyperlipidemia, unspecified: Secondary | ICD-10-CM | POA: Diagnosis not present

## 2017-09-14 DIAGNOSIS — N186 End stage renal disease: Secondary | ICD-10-CM | POA: Diagnosis not present

## 2017-09-16 DIAGNOSIS — D631 Anemia in chronic kidney disease: Secondary | ICD-10-CM | POA: Diagnosis not present

## 2017-09-16 DIAGNOSIS — E785 Hyperlipidemia, unspecified: Secondary | ICD-10-CM | POA: Diagnosis not present

## 2017-09-16 DIAGNOSIS — N186 End stage renal disease: Secondary | ICD-10-CM | POA: Diagnosis not present

## 2017-09-16 DIAGNOSIS — N2581 Secondary hyperparathyroidism of renal origin: Secondary | ICD-10-CM | POA: Diagnosis not present

## 2017-09-19 DIAGNOSIS — D631 Anemia in chronic kidney disease: Secondary | ICD-10-CM | POA: Diagnosis not present

## 2017-09-19 DIAGNOSIS — N186 End stage renal disease: Secondary | ICD-10-CM | POA: Diagnosis not present

## 2017-09-19 DIAGNOSIS — N2581 Secondary hyperparathyroidism of renal origin: Secondary | ICD-10-CM | POA: Diagnosis not present

## 2017-09-19 DIAGNOSIS — E785 Hyperlipidemia, unspecified: Secondary | ICD-10-CM | POA: Diagnosis not present

## 2017-09-21 DIAGNOSIS — D631 Anemia in chronic kidney disease: Secondary | ICD-10-CM | POA: Diagnosis not present

## 2017-09-21 DIAGNOSIS — Z992 Dependence on renal dialysis: Secondary | ICD-10-CM | POA: Diagnosis not present

## 2017-09-21 DIAGNOSIS — N186 End stage renal disease: Secondary | ICD-10-CM | POA: Diagnosis not present

## 2017-09-21 DIAGNOSIS — E785 Hyperlipidemia, unspecified: Secondary | ICD-10-CM | POA: Diagnosis not present

## 2017-09-21 DIAGNOSIS — N2581 Secondary hyperparathyroidism of renal origin: Secondary | ICD-10-CM | POA: Diagnosis not present

## 2017-09-23 DIAGNOSIS — D631 Anemia in chronic kidney disease: Secondary | ICD-10-CM | POA: Diagnosis not present

## 2017-09-23 DIAGNOSIS — N186 End stage renal disease: Secondary | ICD-10-CM | POA: Diagnosis not present

## 2017-09-23 DIAGNOSIS — N2581 Secondary hyperparathyroidism of renal origin: Secondary | ICD-10-CM | POA: Diagnosis not present

## 2017-09-26 DIAGNOSIS — N186 End stage renal disease: Secondary | ICD-10-CM | POA: Diagnosis not present

## 2017-09-26 DIAGNOSIS — N2581 Secondary hyperparathyroidism of renal origin: Secondary | ICD-10-CM | POA: Diagnosis not present

## 2017-09-26 DIAGNOSIS — D631 Anemia in chronic kidney disease: Secondary | ICD-10-CM | POA: Diagnosis not present

## 2017-09-28 DIAGNOSIS — N2581 Secondary hyperparathyroidism of renal origin: Secondary | ICD-10-CM | POA: Diagnosis not present

## 2017-09-28 DIAGNOSIS — N186 End stage renal disease: Secondary | ICD-10-CM | POA: Diagnosis not present

## 2017-09-28 DIAGNOSIS — D631 Anemia in chronic kidney disease: Secondary | ICD-10-CM | POA: Diagnosis not present

## 2017-09-30 DIAGNOSIS — N2581 Secondary hyperparathyroidism of renal origin: Secondary | ICD-10-CM | POA: Diagnosis not present

## 2017-09-30 DIAGNOSIS — D631 Anemia in chronic kidney disease: Secondary | ICD-10-CM | POA: Diagnosis not present

## 2017-09-30 DIAGNOSIS — N186 End stage renal disease: Secondary | ICD-10-CM | POA: Diagnosis not present

## 2017-10-03 DIAGNOSIS — N2581 Secondary hyperparathyroidism of renal origin: Secondary | ICD-10-CM | POA: Diagnosis not present

## 2017-10-03 DIAGNOSIS — D631 Anemia in chronic kidney disease: Secondary | ICD-10-CM | POA: Diagnosis not present

## 2017-10-03 DIAGNOSIS — N186 End stage renal disease: Secondary | ICD-10-CM | POA: Diagnosis not present

## 2017-10-05 DIAGNOSIS — N2581 Secondary hyperparathyroidism of renal origin: Secondary | ICD-10-CM | POA: Diagnosis not present

## 2017-10-05 DIAGNOSIS — N186 End stage renal disease: Secondary | ICD-10-CM | POA: Diagnosis not present

## 2017-10-05 DIAGNOSIS — D631 Anemia in chronic kidney disease: Secondary | ICD-10-CM | POA: Diagnosis not present

## 2017-10-08 DIAGNOSIS — N186 End stage renal disease: Secondary | ICD-10-CM | POA: Diagnosis not present

## 2017-10-08 DIAGNOSIS — N2581 Secondary hyperparathyroidism of renal origin: Secondary | ICD-10-CM | POA: Diagnosis not present

## 2017-10-10 DIAGNOSIS — N186 End stage renal disease: Secondary | ICD-10-CM | POA: Diagnosis not present

## 2017-10-10 DIAGNOSIS — D631 Anemia in chronic kidney disease: Secondary | ICD-10-CM | POA: Diagnosis not present

## 2017-10-10 DIAGNOSIS — N2581 Secondary hyperparathyroidism of renal origin: Secondary | ICD-10-CM | POA: Diagnosis not present

## 2017-10-14 DIAGNOSIS — N2581 Secondary hyperparathyroidism of renal origin: Secondary | ICD-10-CM | POA: Diagnosis not present

## 2017-10-14 DIAGNOSIS — D631 Anemia in chronic kidney disease: Secondary | ICD-10-CM | POA: Diagnosis not present

## 2017-10-14 DIAGNOSIS — N186 End stage renal disease: Secondary | ICD-10-CM | POA: Diagnosis not present

## 2017-10-17 DIAGNOSIS — N186 End stage renal disease: Secondary | ICD-10-CM | POA: Diagnosis not present

## 2017-10-17 DIAGNOSIS — D631 Anemia in chronic kidney disease: Secondary | ICD-10-CM | POA: Diagnosis not present

## 2017-10-17 DIAGNOSIS — N2581 Secondary hyperparathyroidism of renal origin: Secondary | ICD-10-CM | POA: Diagnosis not present

## 2017-10-21 DIAGNOSIS — N2581 Secondary hyperparathyroidism of renal origin: Secondary | ICD-10-CM | POA: Diagnosis not present

## 2017-10-21 DIAGNOSIS — N186 End stage renal disease: Secondary | ICD-10-CM | POA: Diagnosis not present

## 2017-10-21 DIAGNOSIS — D631 Anemia in chronic kidney disease: Secondary | ICD-10-CM | POA: Diagnosis not present

## 2017-10-22 DIAGNOSIS — N186 End stage renal disease: Secondary | ICD-10-CM | POA: Diagnosis not present

## 2017-10-22 DIAGNOSIS — Z992 Dependence on renal dialysis: Secondary | ICD-10-CM | POA: Diagnosis not present

## 2017-10-24 DIAGNOSIS — D631 Anemia in chronic kidney disease: Secondary | ICD-10-CM | POA: Diagnosis not present

## 2017-10-24 DIAGNOSIS — N2581 Secondary hyperparathyroidism of renal origin: Secondary | ICD-10-CM | POA: Diagnosis not present

## 2017-10-24 DIAGNOSIS — N186 End stage renal disease: Secondary | ICD-10-CM | POA: Diagnosis not present

## 2017-10-28 DIAGNOSIS — D631 Anemia in chronic kidney disease: Secondary | ICD-10-CM | POA: Diagnosis not present

## 2017-10-28 DIAGNOSIS — N186 End stage renal disease: Secondary | ICD-10-CM | POA: Diagnosis not present

## 2017-10-28 DIAGNOSIS — N2581 Secondary hyperparathyroidism of renal origin: Secondary | ICD-10-CM | POA: Diagnosis not present

## 2017-10-31 DIAGNOSIS — N186 End stage renal disease: Secondary | ICD-10-CM | POA: Diagnosis not present

## 2017-10-31 DIAGNOSIS — N2581 Secondary hyperparathyroidism of renal origin: Secondary | ICD-10-CM | POA: Diagnosis not present

## 2017-10-31 DIAGNOSIS — D631 Anemia in chronic kidney disease: Secondary | ICD-10-CM | POA: Diagnosis not present

## 2017-11-02 DIAGNOSIS — N2581 Secondary hyperparathyroidism of renal origin: Secondary | ICD-10-CM | POA: Diagnosis not present

## 2017-11-02 DIAGNOSIS — D631 Anemia in chronic kidney disease: Secondary | ICD-10-CM | POA: Diagnosis not present

## 2017-11-02 DIAGNOSIS — N186 End stage renal disease: Secondary | ICD-10-CM | POA: Diagnosis not present

## 2017-11-05 DIAGNOSIS — N186 End stage renal disease: Secondary | ICD-10-CM | POA: Diagnosis not present

## 2017-11-05 DIAGNOSIS — N2581 Secondary hyperparathyroidism of renal origin: Secondary | ICD-10-CM | POA: Diagnosis not present

## 2017-11-07 DIAGNOSIS — N2581 Secondary hyperparathyroidism of renal origin: Secondary | ICD-10-CM | POA: Diagnosis not present

## 2017-11-07 DIAGNOSIS — N186 End stage renal disease: Secondary | ICD-10-CM | POA: Diagnosis not present

## 2017-11-07 DIAGNOSIS — D631 Anemia in chronic kidney disease: Secondary | ICD-10-CM | POA: Diagnosis not present

## 2017-11-09 DIAGNOSIS — D631 Anemia in chronic kidney disease: Secondary | ICD-10-CM | POA: Diagnosis not present

## 2017-11-09 DIAGNOSIS — N186 End stage renal disease: Secondary | ICD-10-CM | POA: Diagnosis not present

## 2017-11-09 DIAGNOSIS — N2581 Secondary hyperparathyroidism of renal origin: Secondary | ICD-10-CM | POA: Diagnosis not present

## 2017-11-12 DIAGNOSIS — N2581 Secondary hyperparathyroidism of renal origin: Secondary | ICD-10-CM | POA: Diagnosis not present

## 2017-11-12 DIAGNOSIS — N186 End stage renal disease: Secondary | ICD-10-CM | POA: Diagnosis not present

## 2017-11-14 DIAGNOSIS — N2581 Secondary hyperparathyroidism of renal origin: Secondary | ICD-10-CM | POA: Diagnosis not present

## 2017-11-14 DIAGNOSIS — N186 End stage renal disease: Secondary | ICD-10-CM | POA: Diagnosis not present

## 2017-11-14 DIAGNOSIS — D631 Anemia in chronic kidney disease: Secondary | ICD-10-CM | POA: Diagnosis not present

## 2017-11-18 DIAGNOSIS — D631 Anemia in chronic kidney disease: Secondary | ICD-10-CM | POA: Diagnosis not present

## 2017-11-18 DIAGNOSIS — N2581 Secondary hyperparathyroidism of renal origin: Secondary | ICD-10-CM | POA: Diagnosis not present

## 2017-11-18 DIAGNOSIS — N186 End stage renal disease: Secondary | ICD-10-CM | POA: Diagnosis not present

## 2017-11-21 DIAGNOSIS — N186 End stage renal disease: Secondary | ICD-10-CM | POA: Diagnosis not present

## 2017-11-21 DIAGNOSIS — D631 Anemia in chronic kidney disease: Secondary | ICD-10-CM | POA: Diagnosis not present

## 2017-11-21 DIAGNOSIS — N2581 Secondary hyperparathyroidism of renal origin: Secondary | ICD-10-CM | POA: Diagnosis not present

## 2017-11-21 DIAGNOSIS — Z992 Dependence on renal dialysis: Secondary | ICD-10-CM | POA: Diagnosis not present

## 2017-11-25 DIAGNOSIS — I10 Essential (primary) hypertension: Secondary | ICD-10-CM | POA: Diagnosis not present

## 2017-11-25 DIAGNOSIS — N2581 Secondary hyperparathyroidism of renal origin: Secondary | ICD-10-CM | POA: Diagnosis not present

## 2017-11-25 DIAGNOSIS — Z23 Encounter for immunization: Secondary | ICD-10-CM | POA: Diagnosis not present

## 2017-11-25 DIAGNOSIS — D631 Anemia in chronic kidney disease: Secondary | ICD-10-CM | POA: Diagnosis not present

## 2017-11-25 DIAGNOSIS — N186 End stage renal disease: Secondary | ICD-10-CM | POA: Diagnosis not present

## 2017-11-25 DIAGNOSIS — D509 Iron deficiency anemia, unspecified: Secondary | ICD-10-CM | POA: Diagnosis not present

## 2017-11-28 DIAGNOSIS — N2581 Secondary hyperparathyroidism of renal origin: Secondary | ICD-10-CM | POA: Diagnosis not present

## 2017-11-28 DIAGNOSIS — D509 Iron deficiency anemia, unspecified: Secondary | ICD-10-CM | POA: Diagnosis not present

## 2017-11-28 DIAGNOSIS — N186 End stage renal disease: Secondary | ICD-10-CM | POA: Diagnosis not present

## 2017-11-28 DIAGNOSIS — Z23 Encounter for immunization: Secondary | ICD-10-CM | POA: Diagnosis not present

## 2017-11-28 DIAGNOSIS — I10 Essential (primary) hypertension: Secondary | ICD-10-CM | POA: Diagnosis not present

## 2017-11-28 DIAGNOSIS — D631 Anemia in chronic kidney disease: Secondary | ICD-10-CM | POA: Diagnosis not present

## 2017-12-02 DIAGNOSIS — N2581 Secondary hyperparathyroidism of renal origin: Secondary | ICD-10-CM | POA: Diagnosis not present

## 2017-12-02 DIAGNOSIS — D631 Anemia in chronic kidney disease: Secondary | ICD-10-CM | POA: Diagnosis not present

## 2017-12-02 DIAGNOSIS — D509 Iron deficiency anemia, unspecified: Secondary | ICD-10-CM | POA: Diagnosis not present

## 2017-12-02 DIAGNOSIS — N186 End stage renal disease: Secondary | ICD-10-CM | POA: Diagnosis not present

## 2017-12-02 DIAGNOSIS — I10 Essential (primary) hypertension: Secondary | ICD-10-CM | POA: Diagnosis not present

## 2017-12-02 DIAGNOSIS — Z23 Encounter for immunization: Secondary | ICD-10-CM | POA: Diagnosis not present

## 2017-12-05 DIAGNOSIS — D509 Iron deficiency anemia, unspecified: Secondary | ICD-10-CM | POA: Diagnosis not present

## 2017-12-05 DIAGNOSIS — Z23 Encounter for immunization: Secondary | ICD-10-CM | POA: Diagnosis not present

## 2017-12-05 DIAGNOSIS — D631 Anemia in chronic kidney disease: Secondary | ICD-10-CM | POA: Diagnosis not present

## 2017-12-05 DIAGNOSIS — N2581 Secondary hyperparathyroidism of renal origin: Secondary | ICD-10-CM | POA: Diagnosis not present

## 2017-12-05 DIAGNOSIS — N186 End stage renal disease: Secondary | ICD-10-CM | POA: Diagnosis not present

## 2017-12-05 DIAGNOSIS — I10 Essential (primary) hypertension: Secondary | ICD-10-CM | POA: Diagnosis not present

## 2017-12-07 DIAGNOSIS — D631 Anemia in chronic kidney disease: Secondary | ICD-10-CM | POA: Diagnosis not present

## 2017-12-07 DIAGNOSIS — Z23 Encounter for immunization: Secondary | ICD-10-CM | POA: Diagnosis not present

## 2017-12-07 DIAGNOSIS — N2581 Secondary hyperparathyroidism of renal origin: Secondary | ICD-10-CM | POA: Diagnosis not present

## 2017-12-07 DIAGNOSIS — N186 End stage renal disease: Secondary | ICD-10-CM | POA: Diagnosis not present

## 2017-12-07 DIAGNOSIS — D509 Iron deficiency anemia, unspecified: Secondary | ICD-10-CM | POA: Diagnosis not present

## 2017-12-07 DIAGNOSIS — I10 Essential (primary) hypertension: Secondary | ICD-10-CM | POA: Diagnosis not present

## 2017-12-10 DIAGNOSIS — N2581 Secondary hyperparathyroidism of renal origin: Secondary | ICD-10-CM | POA: Diagnosis not present

## 2017-12-10 DIAGNOSIS — I10 Essential (primary) hypertension: Secondary | ICD-10-CM | POA: Diagnosis not present

## 2017-12-10 DIAGNOSIS — N186 End stage renal disease: Secondary | ICD-10-CM | POA: Diagnosis not present

## 2017-12-12 DIAGNOSIS — Z23 Encounter for immunization: Secondary | ICD-10-CM | POA: Diagnosis not present

## 2017-12-12 DIAGNOSIS — I10 Essential (primary) hypertension: Secondary | ICD-10-CM | POA: Diagnosis not present

## 2017-12-12 DIAGNOSIS — D631 Anemia in chronic kidney disease: Secondary | ICD-10-CM | POA: Diagnosis not present

## 2017-12-12 DIAGNOSIS — D509 Iron deficiency anemia, unspecified: Secondary | ICD-10-CM | POA: Diagnosis not present

## 2017-12-12 DIAGNOSIS — N2581 Secondary hyperparathyroidism of renal origin: Secondary | ICD-10-CM | POA: Diagnosis not present

## 2017-12-12 DIAGNOSIS — N186 End stage renal disease: Secondary | ICD-10-CM | POA: Diagnosis not present

## 2017-12-16 DIAGNOSIS — Z23 Encounter for immunization: Secondary | ICD-10-CM | POA: Diagnosis not present

## 2017-12-16 DIAGNOSIS — D631 Anemia in chronic kidney disease: Secondary | ICD-10-CM | POA: Diagnosis not present

## 2017-12-16 DIAGNOSIS — N2581 Secondary hyperparathyroidism of renal origin: Secondary | ICD-10-CM | POA: Diagnosis not present

## 2017-12-16 DIAGNOSIS — I10 Essential (primary) hypertension: Secondary | ICD-10-CM | POA: Diagnosis not present

## 2017-12-16 DIAGNOSIS — N186 End stage renal disease: Secondary | ICD-10-CM | POA: Diagnosis not present

## 2017-12-16 DIAGNOSIS — D509 Iron deficiency anemia, unspecified: Secondary | ICD-10-CM | POA: Diagnosis not present

## 2017-12-19 DIAGNOSIS — Z23 Encounter for immunization: Secondary | ICD-10-CM | POA: Diagnosis not present

## 2017-12-19 DIAGNOSIS — N186 End stage renal disease: Secondary | ICD-10-CM | POA: Diagnosis not present

## 2017-12-19 DIAGNOSIS — I10 Essential (primary) hypertension: Secondary | ICD-10-CM | POA: Diagnosis not present

## 2017-12-19 DIAGNOSIS — D509 Iron deficiency anemia, unspecified: Secondary | ICD-10-CM | POA: Diagnosis not present

## 2017-12-19 DIAGNOSIS — D631 Anemia in chronic kidney disease: Secondary | ICD-10-CM | POA: Diagnosis not present

## 2017-12-19 DIAGNOSIS — N2581 Secondary hyperparathyroidism of renal origin: Secondary | ICD-10-CM | POA: Diagnosis not present

## 2017-12-21 DIAGNOSIS — N186 End stage renal disease: Secondary | ICD-10-CM | POA: Diagnosis not present

## 2017-12-21 DIAGNOSIS — D631 Anemia in chronic kidney disease: Secondary | ICD-10-CM | POA: Diagnosis not present

## 2017-12-21 DIAGNOSIS — D509 Iron deficiency anemia, unspecified: Secondary | ICD-10-CM | POA: Diagnosis not present

## 2017-12-21 DIAGNOSIS — N2581 Secondary hyperparathyroidism of renal origin: Secondary | ICD-10-CM | POA: Diagnosis not present

## 2017-12-21 DIAGNOSIS — I10 Essential (primary) hypertension: Secondary | ICD-10-CM | POA: Diagnosis not present

## 2017-12-21 DIAGNOSIS — Z23 Encounter for immunization: Secondary | ICD-10-CM | POA: Diagnosis not present

## 2017-12-22 DIAGNOSIS — N186 End stage renal disease: Secondary | ICD-10-CM | POA: Diagnosis not present

## 2017-12-22 DIAGNOSIS — Z992 Dependence on renal dialysis: Secondary | ICD-10-CM | POA: Diagnosis not present

## 2017-12-23 DIAGNOSIS — N186 End stage renal disease: Secondary | ICD-10-CM | POA: Diagnosis not present

## 2017-12-23 DIAGNOSIS — D509 Iron deficiency anemia, unspecified: Secondary | ICD-10-CM | POA: Diagnosis not present

## 2017-12-23 DIAGNOSIS — D631 Anemia in chronic kidney disease: Secondary | ICD-10-CM | POA: Diagnosis not present

## 2017-12-23 DIAGNOSIS — I10 Essential (primary) hypertension: Secondary | ICD-10-CM | POA: Diagnosis not present

## 2017-12-23 DIAGNOSIS — Z418 Encounter for other procedures for purposes other than remedying health state: Secondary | ICD-10-CM | POA: Diagnosis not present

## 2017-12-23 DIAGNOSIS — N2581 Secondary hyperparathyroidism of renal origin: Secondary | ICD-10-CM | POA: Diagnosis not present

## 2017-12-26 DIAGNOSIS — Z418 Encounter for other procedures for purposes other than remedying health state: Secondary | ICD-10-CM | POA: Diagnosis not present

## 2017-12-26 DIAGNOSIS — I10 Essential (primary) hypertension: Secondary | ICD-10-CM | POA: Diagnosis not present

## 2017-12-26 DIAGNOSIS — D631 Anemia in chronic kidney disease: Secondary | ICD-10-CM | POA: Diagnosis not present

## 2017-12-26 DIAGNOSIS — D509 Iron deficiency anemia, unspecified: Secondary | ICD-10-CM | POA: Diagnosis not present

## 2017-12-26 DIAGNOSIS — N186 End stage renal disease: Secondary | ICD-10-CM | POA: Diagnosis not present

## 2017-12-26 DIAGNOSIS — N2581 Secondary hyperparathyroidism of renal origin: Secondary | ICD-10-CM | POA: Diagnosis not present

## 2017-12-27 DIAGNOSIS — Z992 Dependence on renal dialysis: Secondary | ICD-10-CM | POA: Diagnosis not present

## 2017-12-27 DIAGNOSIS — N186 End stage renal disease: Secondary | ICD-10-CM | POA: Diagnosis not present

## 2017-12-27 DIAGNOSIS — T82590A Other mechanical complication of surgically created arteriovenous fistula, initial encounter: Secondary | ICD-10-CM | POA: Diagnosis not present

## 2017-12-30 DIAGNOSIS — N186 End stage renal disease: Secondary | ICD-10-CM | POA: Diagnosis not present

## 2017-12-30 DIAGNOSIS — I10 Essential (primary) hypertension: Secondary | ICD-10-CM | POA: Diagnosis not present

## 2017-12-30 DIAGNOSIS — D631 Anemia in chronic kidney disease: Secondary | ICD-10-CM | POA: Diagnosis not present

## 2017-12-30 DIAGNOSIS — N2581 Secondary hyperparathyroidism of renal origin: Secondary | ICD-10-CM | POA: Diagnosis not present

## 2017-12-30 DIAGNOSIS — Z418 Encounter for other procedures for purposes other than remedying health state: Secondary | ICD-10-CM | POA: Diagnosis not present

## 2017-12-30 DIAGNOSIS — D509 Iron deficiency anemia, unspecified: Secondary | ICD-10-CM | POA: Diagnosis not present

## 2018-01-02 DIAGNOSIS — I10 Essential (primary) hypertension: Secondary | ICD-10-CM | POA: Diagnosis not present

## 2018-01-02 DIAGNOSIS — N2581 Secondary hyperparathyroidism of renal origin: Secondary | ICD-10-CM | POA: Diagnosis not present

## 2018-01-02 DIAGNOSIS — Z418 Encounter for other procedures for purposes other than remedying health state: Secondary | ICD-10-CM | POA: Diagnosis not present

## 2018-01-02 DIAGNOSIS — N186 End stage renal disease: Secondary | ICD-10-CM | POA: Diagnosis not present

## 2018-01-02 DIAGNOSIS — D631 Anemia in chronic kidney disease: Secondary | ICD-10-CM | POA: Diagnosis not present

## 2018-01-02 DIAGNOSIS — D509 Iron deficiency anemia, unspecified: Secondary | ICD-10-CM | POA: Diagnosis not present

## 2018-01-06 DIAGNOSIS — Z418 Encounter for other procedures for purposes other than remedying health state: Secondary | ICD-10-CM | POA: Diagnosis not present

## 2018-01-06 DIAGNOSIS — N2581 Secondary hyperparathyroidism of renal origin: Secondary | ICD-10-CM | POA: Diagnosis not present

## 2018-01-06 DIAGNOSIS — I10 Essential (primary) hypertension: Secondary | ICD-10-CM | POA: Diagnosis not present

## 2018-01-06 DIAGNOSIS — D509 Iron deficiency anemia, unspecified: Secondary | ICD-10-CM | POA: Diagnosis not present

## 2018-01-06 DIAGNOSIS — N186 End stage renal disease: Secondary | ICD-10-CM | POA: Diagnosis not present

## 2018-01-06 DIAGNOSIS — D631 Anemia in chronic kidney disease: Secondary | ICD-10-CM | POA: Diagnosis not present

## 2018-01-07 DIAGNOSIS — N186 End stage renal disease: Secondary | ICD-10-CM | POA: Diagnosis not present

## 2018-01-07 DIAGNOSIS — E8779 Other fluid overload: Secondary | ICD-10-CM | POA: Diagnosis not present

## 2018-01-07 DIAGNOSIS — I10 Essential (primary) hypertension: Secondary | ICD-10-CM | POA: Diagnosis not present

## 2018-01-09 DIAGNOSIS — D509 Iron deficiency anemia, unspecified: Secondary | ICD-10-CM | POA: Diagnosis not present

## 2018-01-09 DIAGNOSIS — Z418 Encounter for other procedures for purposes other than remedying health state: Secondary | ICD-10-CM | POA: Diagnosis not present

## 2018-01-09 DIAGNOSIS — N2581 Secondary hyperparathyroidism of renal origin: Secondary | ICD-10-CM | POA: Diagnosis not present

## 2018-01-09 DIAGNOSIS — N186 End stage renal disease: Secondary | ICD-10-CM | POA: Diagnosis not present

## 2018-01-09 DIAGNOSIS — D631 Anemia in chronic kidney disease: Secondary | ICD-10-CM | POA: Diagnosis not present

## 2018-01-09 DIAGNOSIS — I10 Essential (primary) hypertension: Secondary | ICD-10-CM | POA: Diagnosis not present

## 2018-01-13 DIAGNOSIS — D631 Anemia in chronic kidney disease: Secondary | ICD-10-CM | POA: Diagnosis not present

## 2018-01-13 DIAGNOSIS — N186 End stage renal disease: Secondary | ICD-10-CM | POA: Diagnosis not present

## 2018-01-13 DIAGNOSIS — I10 Essential (primary) hypertension: Secondary | ICD-10-CM | POA: Diagnosis not present

## 2018-01-13 DIAGNOSIS — Z418 Encounter for other procedures for purposes other than remedying health state: Secondary | ICD-10-CM | POA: Diagnosis not present

## 2018-01-13 DIAGNOSIS — D509 Iron deficiency anemia, unspecified: Secondary | ICD-10-CM | POA: Diagnosis not present

## 2018-01-13 DIAGNOSIS — N2581 Secondary hyperparathyroidism of renal origin: Secondary | ICD-10-CM | POA: Diagnosis not present

## 2018-01-16 DIAGNOSIS — N2581 Secondary hyperparathyroidism of renal origin: Secondary | ICD-10-CM | POA: Diagnosis not present

## 2018-01-16 DIAGNOSIS — D631 Anemia in chronic kidney disease: Secondary | ICD-10-CM | POA: Diagnosis not present

## 2018-01-16 DIAGNOSIS — I10 Essential (primary) hypertension: Secondary | ICD-10-CM | POA: Diagnosis not present

## 2018-01-16 DIAGNOSIS — D509 Iron deficiency anemia, unspecified: Secondary | ICD-10-CM | POA: Diagnosis not present

## 2018-01-16 DIAGNOSIS — Z418 Encounter for other procedures for purposes other than remedying health state: Secondary | ICD-10-CM | POA: Diagnosis not present

## 2018-01-16 DIAGNOSIS — N186 End stage renal disease: Secondary | ICD-10-CM | POA: Diagnosis not present

## 2018-01-18 DIAGNOSIS — N186 End stage renal disease: Secondary | ICD-10-CM | POA: Diagnosis not present

## 2018-01-18 DIAGNOSIS — I10 Essential (primary) hypertension: Secondary | ICD-10-CM | POA: Diagnosis not present

## 2018-01-18 DIAGNOSIS — Z418 Encounter for other procedures for purposes other than remedying health state: Secondary | ICD-10-CM | POA: Diagnosis not present

## 2018-01-18 DIAGNOSIS — D631 Anemia in chronic kidney disease: Secondary | ICD-10-CM | POA: Diagnosis not present

## 2018-01-18 DIAGNOSIS — D509 Iron deficiency anemia, unspecified: Secondary | ICD-10-CM | POA: Diagnosis not present

## 2018-01-18 DIAGNOSIS — N2581 Secondary hyperparathyroidism of renal origin: Secondary | ICD-10-CM | POA: Diagnosis not present

## 2018-01-21 DIAGNOSIS — Z992 Dependence on renal dialysis: Secondary | ICD-10-CM | POA: Diagnosis not present

## 2018-01-21 DIAGNOSIS — N186 End stage renal disease: Secondary | ICD-10-CM | POA: Diagnosis not present

## 2018-01-23 DIAGNOSIS — D509 Iron deficiency anemia, unspecified: Secondary | ICD-10-CM | POA: Diagnosis not present

## 2018-01-23 DIAGNOSIS — N186 End stage renal disease: Secondary | ICD-10-CM | POA: Diagnosis not present

## 2018-01-23 DIAGNOSIS — D631 Anemia in chronic kidney disease: Secondary | ICD-10-CM | POA: Diagnosis not present

## 2018-01-23 DIAGNOSIS — N2581 Secondary hyperparathyroidism of renal origin: Secondary | ICD-10-CM | POA: Diagnosis not present

## 2018-01-25 DIAGNOSIS — N186 End stage renal disease: Secondary | ICD-10-CM | POA: Diagnosis not present

## 2018-01-25 DIAGNOSIS — N2581 Secondary hyperparathyroidism of renal origin: Secondary | ICD-10-CM | POA: Diagnosis not present

## 2018-01-25 DIAGNOSIS — D631 Anemia in chronic kidney disease: Secondary | ICD-10-CM | POA: Diagnosis not present

## 2018-01-25 DIAGNOSIS — D509 Iron deficiency anemia, unspecified: Secondary | ICD-10-CM | POA: Diagnosis not present

## 2018-01-27 DIAGNOSIS — D509 Iron deficiency anemia, unspecified: Secondary | ICD-10-CM | POA: Diagnosis not present

## 2018-01-27 DIAGNOSIS — D631 Anemia in chronic kidney disease: Secondary | ICD-10-CM | POA: Diagnosis not present

## 2018-01-27 DIAGNOSIS — N2581 Secondary hyperparathyroidism of renal origin: Secondary | ICD-10-CM | POA: Diagnosis not present

## 2018-01-27 DIAGNOSIS — N186 End stage renal disease: Secondary | ICD-10-CM | POA: Diagnosis not present

## 2018-01-30 DIAGNOSIS — N2581 Secondary hyperparathyroidism of renal origin: Secondary | ICD-10-CM | POA: Diagnosis not present

## 2018-01-30 DIAGNOSIS — N186 End stage renal disease: Secondary | ICD-10-CM | POA: Diagnosis not present

## 2018-01-30 DIAGNOSIS — D631 Anemia in chronic kidney disease: Secondary | ICD-10-CM | POA: Diagnosis not present

## 2018-01-30 DIAGNOSIS — D509 Iron deficiency anemia, unspecified: Secondary | ICD-10-CM | POA: Diagnosis not present

## 2018-02-03 DIAGNOSIS — D509 Iron deficiency anemia, unspecified: Secondary | ICD-10-CM | POA: Diagnosis not present

## 2018-02-03 DIAGNOSIS — N2581 Secondary hyperparathyroidism of renal origin: Secondary | ICD-10-CM | POA: Diagnosis not present

## 2018-02-03 DIAGNOSIS — N186 End stage renal disease: Secondary | ICD-10-CM | POA: Diagnosis not present

## 2018-02-03 DIAGNOSIS — D631 Anemia in chronic kidney disease: Secondary | ICD-10-CM | POA: Diagnosis not present

## 2018-02-06 DIAGNOSIS — N2581 Secondary hyperparathyroidism of renal origin: Secondary | ICD-10-CM | POA: Diagnosis not present

## 2018-02-06 DIAGNOSIS — D509 Iron deficiency anemia, unspecified: Secondary | ICD-10-CM | POA: Diagnosis not present

## 2018-02-06 DIAGNOSIS — N186 End stage renal disease: Secondary | ICD-10-CM | POA: Diagnosis not present

## 2018-02-06 DIAGNOSIS — D631 Anemia in chronic kidney disease: Secondary | ICD-10-CM | POA: Diagnosis not present

## 2018-02-10 DIAGNOSIS — D509 Iron deficiency anemia, unspecified: Secondary | ICD-10-CM | POA: Diagnosis not present

## 2018-02-10 DIAGNOSIS — N186 End stage renal disease: Secondary | ICD-10-CM | POA: Diagnosis not present

## 2018-02-10 DIAGNOSIS — D631 Anemia in chronic kidney disease: Secondary | ICD-10-CM | POA: Diagnosis not present

## 2018-02-10 DIAGNOSIS — Z1159 Encounter for screening for other viral diseases: Secondary | ICD-10-CM | POA: Diagnosis not present

## 2018-02-10 DIAGNOSIS — N2581 Secondary hyperparathyroidism of renal origin: Secondary | ICD-10-CM | POA: Diagnosis not present

## 2018-02-13 DIAGNOSIS — N2581 Secondary hyperparathyroidism of renal origin: Secondary | ICD-10-CM | POA: Diagnosis not present

## 2018-02-13 DIAGNOSIS — D509 Iron deficiency anemia, unspecified: Secondary | ICD-10-CM | POA: Diagnosis not present

## 2018-02-13 DIAGNOSIS — D631 Anemia in chronic kidney disease: Secondary | ICD-10-CM | POA: Diagnosis not present

## 2018-02-13 DIAGNOSIS — N186 End stage renal disease: Secondary | ICD-10-CM | POA: Diagnosis not present

## 2018-02-14 DIAGNOSIS — D631 Anemia in chronic kidney disease: Secondary | ICD-10-CM | POA: Diagnosis not present

## 2018-02-14 DIAGNOSIS — N2581 Secondary hyperparathyroidism of renal origin: Secondary | ICD-10-CM | POA: Diagnosis not present

## 2018-02-14 DIAGNOSIS — D509 Iron deficiency anemia, unspecified: Secondary | ICD-10-CM | POA: Diagnosis not present

## 2018-02-14 DIAGNOSIS — N186 End stage renal disease: Secondary | ICD-10-CM | POA: Diagnosis not present

## 2018-02-17 DIAGNOSIS — N186 End stage renal disease: Secondary | ICD-10-CM | POA: Diagnosis not present

## 2018-02-17 DIAGNOSIS — N2581 Secondary hyperparathyroidism of renal origin: Secondary | ICD-10-CM | POA: Diagnosis not present

## 2018-02-17 DIAGNOSIS — D509 Iron deficiency anemia, unspecified: Secondary | ICD-10-CM | POA: Diagnosis not present

## 2018-02-17 DIAGNOSIS — D631 Anemia in chronic kidney disease: Secondary | ICD-10-CM | POA: Diagnosis not present

## 2018-02-20 DIAGNOSIS — N2581 Secondary hyperparathyroidism of renal origin: Secondary | ICD-10-CM | POA: Diagnosis not present

## 2018-02-20 DIAGNOSIS — D509 Iron deficiency anemia, unspecified: Secondary | ICD-10-CM | POA: Diagnosis not present

## 2018-02-20 DIAGNOSIS — N186 End stage renal disease: Secondary | ICD-10-CM | POA: Diagnosis not present

## 2018-02-20 DIAGNOSIS — R509 Fever, unspecified: Secondary | ICD-10-CM | POA: Diagnosis not present

## 2018-02-20 DIAGNOSIS — D631 Anemia in chronic kidney disease: Secondary | ICD-10-CM | POA: Diagnosis not present

## 2018-02-21 DIAGNOSIS — Z992 Dependence on renal dialysis: Secondary | ICD-10-CM | POA: Diagnosis not present

## 2018-02-21 DIAGNOSIS — N186 End stage renal disease: Secondary | ICD-10-CM | POA: Diagnosis not present

## 2018-02-24 DIAGNOSIS — N186 End stage renal disease: Secondary | ICD-10-CM | POA: Diagnosis not present

## 2018-02-24 DIAGNOSIS — D509 Iron deficiency anemia, unspecified: Secondary | ICD-10-CM | POA: Diagnosis not present

## 2018-02-24 DIAGNOSIS — D631 Anemia in chronic kidney disease: Secondary | ICD-10-CM | POA: Diagnosis not present

## 2018-02-24 DIAGNOSIS — N2581 Secondary hyperparathyroidism of renal origin: Secondary | ICD-10-CM | POA: Diagnosis not present

## 2018-02-27 DIAGNOSIS — D631 Anemia in chronic kidney disease: Secondary | ICD-10-CM | POA: Diagnosis not present

## 2018-02-27 DIAGNOSIS — N186 End stage renal disease: Secondary | ICD-10-CM | POA: Diagnosis not present

## 2018-02-27 DIAGNOSIS — D509 Iron deficiency anemia, unspecified: Secondary | ICD-10-CM | POA: Diagnosis not present

## 2018-02-27 DIAGNOSIS — N2581 Secondary hyperparathyroidism of renal origin: Secondary | ICD-10-CM | POA: Diagnosis not present

## 2018-03-03 DIAGNOSIS — N186 End stage renal disease: Secondary | ICD-10-CM | POA: Diagnosis not present

## 2018-03-03 DIAGNOSIS — N2581 Secondary hyperparathyroidism of renal origin: Secondary | ICD-10-CM | POA: Diagnosis not present

## 2018-03-03 DIAGNOSIS — D509 Iron deficiency anemia, unspecified: Secondary | ICD-10-CM | POA: Diagnosis not present

## 2018-03-03 DIAGNOSIS — D631 Anemia in chronic kidney disease: Secondary | ICD-10-CM | POA: Diagnosis not present

## 2018-03-03 DIAGNOSIS — T829XXD Unspecified complication of cardiac and vascular prosthetic device, implant and graft, subsequent encounter: Secondary | ICD-10-CM | POA: Diagnosis not present

## 2018-03-06 DIAGNOSIS — N186 End stage renal disease: Secondary | ICD-10-CM | POA: Diagnosis not present

## 2018-03-06 DIAGNOSIS — N2581 Secondary hyperparathyroidism of renal origin: Secondary | ICD-10-CM | POA: Diagnosis not present

## 2018-03-06 DIAGNOSIS — D631 Anemia in chronic kidney disease: Secondary | ICD-10-CM | POA: Diagnosis not present

## 2018-03-06 DIAGNOSIS — D509 Iron deficiency anemia, unspecified: Secondary | ICD-10-CM | POA: Diagnosis not present

## 2018-03-08 DIAGNOSIS — N186 End stage renal disease: Secondary | ICD-10-CM | POA: Diagnosis not present

## 2018-03-08 DIAGNOSIS — N2581 Secondary hyperparathyroidism of renal origin: Secondary | ICD-10-CM | POA: Diagnosis not present

## 2018-03-08 DIAGNOSIS — D631 Anemia in chronic kidney disease: Secondary | ICD-10-CM | POA: Diagnosis not present

## 2018-03-08 DIAGNOSIS — D509 Iron deficiency anemia, unspecified: Secondary | ICD-10-CM | POA: Diagnosis not present

## 2018-03-10 DIAGNOSIS — N186 End stage renal disease: Secondary | ICD-10-CM | POA: Diagnosis not present

## 2018-03-10 DIAGNOSIS — D631 Anemia in chronic kidney disease: Secondary | ICD-10-CM | POA: Diagnosis not present

## 2018-03-10 DIAGNOSIS — D509 Iron deficiency anemia, unspecified: Secondary | ICD-10-CM | POA: Diagnosis not present

## 2018-03-10 DIAGNOSIS — N2581 Secondary hyperparathyroidism of renal origin: Secondary | ICD-10-CM | POA: Diagnosis not present

## 2018-03-13 DIAGNOSIS — N186 End stage renal disease: Secondary | ICD-10-CM | POA: Diagnosis not present

## 2018-03-13 DIAGNOSIS — D631 Anemia in chronic kidney disease: Secondary | ICD-10-CM | POA: Diagnosis not present

## 2018-03-13 DIAGNOSIS — D509 Iron deficiency anemia, unspecified: Secondary | ICD-10-CM | POA: Diagnosis not present

## 2018-03-13 DIAGNOSIS — N2581 Secondary hyperparathyroidism of renal origin: Secondary | ICD-10-CM | POA: Diagnosis not present

## 2018-03-15 DIAGNOSIS — Z992 Dependence on renal dialysis: Secondary | ICD-10-CM | POA: Diagnosis not present

## 2018-03-15 DIAGNOSIS — I5022 Chronic systolic (congestive) heart failure: Secondary | ICD-10-CM | POA: Diagnosis not present

## 2018-03-15 DIAGNOSIS — N186 End stage renal disease: Secondary | ICD-10-CM | POA: Diagnosis not present

## 2018-03-15 DIAGNOSIS — I480 Paroxysmal atrial fibrillation: Secondary | ICD-10-CM | POA: Diagnosis not present

## 2018-03-17 DIAGNOSIS — D509 Iron deficiency anemia, unspecified: Secondary | ICD-10-CM | POA: Diagnosis not present

## 2018-03-17 DIAGNOSIS — D631 Anemia in chronic kidney disease: Secondary | ICD-10-CM | POA: Diagnosis not present

## 2018-03-17 DIAGNOSIS — N2581 Secondary hyperparathyroidism of renal origin: Secondary | ICD-10-CM | POA: Diagnosis not present

## 2018-03-17 DIAGNOSIS — N186 End stage renal disease: Secondary | ICD-10-CM | POA: Diagnosis not present

## 2018-03-20 DIAGNOSIS — D631 Anemia in chronic kidney disease: Secondary | ICD-10-CM | POA: Diagnosis not present

## 2018-03-20 DIAGNOSIS — N186 End stage renal disease: Secondary | ICD-10-CM | POA: Diagnosis not present

## 2018-03-20 DIAGNOSIS — N2581 Secondary hyperparathyroidism of renal origin: Secondary | ICD-10-CM | POA: Diagnosis not present

## 2018-03-20 DIAGNOSIS — D509 Iron deficiency anemia, unspecified: Secondary | ICD-10-CM | POA: Diagnosis not present

## 2018-03-22 DIAGNOSIS — N186 End stage renal disease: Secondary | ICD-10-CM | POA: Diagnosis not present

## 2018-03-22 DIAGNOSIS — N2581 Secondary hyperparathyroidism of renal origin: Secondary | ICD-10-CM | POA: Diagnosis not present

## 2018-03-22 DIAGNOSIS — D631 Anemia in chronic kidney disease: Secondary | ICD-10-CM | POA: Diagnosis not present

## 2018-03-22 DIAGNOSIS — D509 Iron deficiency anemia, unspecified: Secondary | ICD-10-CM | POA: Diagnosis not present

## 2018-03-24 DIAGNOSIS — N2581 Secondary hyperparathyroidism of renal origin: Secondary | ICD-10-CM | POA: Diagnosis not present

## 2018-03-24 DIAGNOSIS — Z992 Dependence on renal dialysis: Secondary | ICD-10-CM | POA: Diagnosis not present

## 2018-03-24 DIAGNOSIS — D631 Anemia in chronic kidney disease: Secondary | ICD-10-CM | POA: Diagnosis not present

## 2018-03-24 DIAGNOSIS — N186 End stage renal disease: Secondary | ICD-10-CM | POA: Diagnosis not present

## 2018-03-24 DIAGNOSIS — D509 Iron deficiency anemia, unspecified: Secondary | ICD-10-CM | POA: Diagnosis not present

## 2018-03-27 DIAGNOSIS — I10 Essential (primary) hypertension: Secondary | ICD-10-CM | POA: Diagnosis not present

## 2018-03-27 DIAGNOSIS — N2581 Secondary hyperparathyroidism of renal origin: Secondary | ICD-10-CM | POA: Diagnosis not present

## 2018-03-27 DIAGNOSIS — D631 Anemia in chronic kidney disease: Secondary | ICD-10-CM | POA: Diagnosis not present

## 2018-03-27 DIAGNOSIS — N186 End stage renal disease: Secondary | ICD-10-CM | POA: Diagnosis not present

## 2018-03-31 DIAGNOSIS — N186 End stage renal disease: Secondary | ICD-10-CM | POA: Diagnosis not present

## 2018-03-31 DIAGNOSIS — N2581 Secondary hyperparathyroidism of renal origin: Secondary | ICD-10-CM | POA: Diagnosis not present

## 2018-03-31 DIAGNOSIS — I10 Essential (primary) hypertension: Secondary | ICD-10-CM | POA: Diagnosis not present

## 2018-03-31 DIAGNOSIS — D631 Anemia in chronic kidney disease: Secondary | ICD-10-CM | POA: Diagnosis not present

## 2018-04-03 DIAGNOSIS — N186 End stage renal disease: Secondary | ICD-10-CM | POA: Diagnosis not present

## 2018-04-03 DIAGNOSIS — I10 Essential (primary) hypertension: Secondary | ICD-10-CM | POA: Diagnosis not present

## 2018-04-03 DIAGNOSIS — D631 Anemia in chronic kidney disease: Secondary | ICD-10-CM | POA: Diagnosis not present

## 2018-04-03 DIAGNOSIS — N2581 Secondary hyperparathyroidism of renal origin: Secondary | ICD-10-CM | POA: Diagnosis not present

## 2018-04-07 DIAGNOSIS — N2581 Secondary hyperparathyroidism of renal origin: Secondary | ICD-10-CM | POA: Diagnosis not present

## 2018-04-07 DIAGNOSIS — Z992 Dependence on renal dialysis: Secondary | ICD-10-CM | POA: Diagnosis not present

## 2018-04-07 DIAGNOSIS — R0789 Other chest pain: Secondary | ICD-10-CM | POA: Diagnosis not present

## 2018-04-07 DIAGNOSIS — R000 Tachycardia, unspecified: Secondary | ICD-10-CM | POA: Diagnosis not present

## 2018-04-07 DIAGNOSIS — Z7901 Long term (current) use of anticoagulants: Secondary | ICD-10-CM | POA: Diagnosis not present

## 2018-04-07 DIAGNOSIS — Z79899 Other long term (current) drug therapy: Secondary | ICD-10-CM | POA: Diagnosis not present

## 2018-04-07 DIAGNOSIS — M6281 Muscle weakness (generalized): Secondary | ICD-10-CM | POA: Diagnosis not present

## 2018-04-07 DIAGNOSIS — I480 Paroxysmal atrial fibrillation: Secondary | ICD-10-CM | POA: Diagnosis not present

## 2018-04-07 DIAGNOSIS — I120 Hypertensive chronic kidney disease with stage 5 chronic kidney disease or end stage renal disease: Secondary | ICD-10-CM | POA: Diagnosis not present

## 2018-04-07 DIAGNOSIS — E213 Hyperparathyroidism, unspecified: Secondary | ICD-10-CM | POA: Diagnosis not present

## 2018-04-07 DIAGNOSIS — I493 Ventricular premature depolarization: Secondary | ICD-10-CM | POA: Diagnosis not present

## 2018-04-07 DIAGNOSIS — I5022 Chronic systolic (congestive) heart failure: Secondary | ICD-10-CM | POA: Diagnosis not present

## 2018-04-07 DIAGNOSIS — I4892 Unspecified atrial flutter: Secondary | ICD-10-CM | POA: Diagnosis not present

## 2018-04-07 DIAGNOSIS — Z8673 Personal history of transient ischemic attack (TIA), and cerebral infarction without residual deficits: Secondary | ICD-10-CM | POA: Diagnosis not present

## 2018-04-07 DIAGNOSIS — R0602 Shortness of breath: Secondary | ICD-10-CM | POA: Diagnosis not present

## 2018-04-07 DIAGNOSIS — I130 Hypertensive heart and chronic kidney disease with heart failure and stage 1 through stage 4 chronic kidney disease, or unspecified chronic kidney disease: Secondary | ICD-10-CM | POA: Diagnosis not present

## 2018-04-07 DIAGNOSIS — Z9114 Patient's other noncompliance with medication regimen: Secondary | ICD-10-CM | POA: Diagnosis not present

## 2018-04-07 DIAGNOSIS — I10 Essential (primary) hypertension: Secondary | ICD-10-CM | POA: Diagnosis not present

## 2018-04-07 DIAGNOSIS — N186 End stage renal disease: Secondary | ICD-10-CM | POA: Diagnosis not present

## 2018-04-07 DIAGNOSIS — I132 Hypertensive heart and chronic kidney disease with heart failure and with stage 5 chronic kidney disease, or end stage renal disease: Secondary | ICD-10-CM | POA: Diagnosis not present

## 2018-04-07 DIAGNOSIS — R5381 Other malaise: Secondary | ICD-10-CM | POA: Diagnosis not present

## 2018-04-07 DIAGNOSIS — I4891 Unspecified atrial fibrillation: Secondary | ICD-10-CM | POA: Diagnosis not present

## 2018-04-07 DIAGNOSIS — R072 Precordial pain: Secondary | ICD-10-CM | POA: Diagnosis not present

## 2018-04-07 DIAGNOSIS — D631 Anemia in chronic kidney disease: Secondary | ICD-10-CM | POA: Diagnosis not present

## 2018-04-07 DIAGNOSIS — E875 Hyperkalemia: Secondary | ICD-10-CM | POA: Diagnosis not present

## 2018-04-07 DIAGNOSIS — I150 Renovascular hypertension: Secondary | ICD-10-CM | POA: Diagnosis not present

## 2018-04-07 DIAGNOSIS — R7989 Other specified abnormal findings of blood chemistry: Secondary | ICD-10-CM | POA: Diagnosis not present

## 2018-04-08 DIAGNOSIS — I480 Paroxysmal atrial fibrillation: Secondary | ICD-10-CM | POA: Diagnosis not present

## 2018-04-08 DIAGNOSIS — I130 Hypertensive heart and chronic kidney disease with heart failure and stage 1 through stage 4 chronic kidney disease, or unspecified chronic kidney disease: Secondary | ICD-10-CM | POA: Diagnosis not present

## 2018-04-08 DIAGNOSIS — E875 Hyperkalemia: Secondary | ICD-10-CM | POA: Diagnosis not present

## 2018-04-08 DIAGNOSIS — I4891 Unspecified atrial fibrillation: Secondary | ICD-10-CM | POA: Diagnosis not present

## 2018-04-08 DIAGNOSIS — Z992 Dependence on renal dialysis: Secondary | ICD-10-CM | POA: Diagnosis not present

## 2018-04-10 DIAGNOSIS — D631 Anemia in chronic kidney disease: Secondary | ICD-10-CM | POA: Diagnosis not present

## 2018-04-10 DIAGNOSIS — R9431 Abnormal electrocardiogram [ECG] [EKG]: Secondary | ICD-10-CM | POA: Diagnosis not present

## 2018-04-10 DIAGNOSIS — N2581 Secondary hyperparathyroidism of renal origin: Secondary | ICD-10-CM | POA: Diagnosis not present

## 2018-04-10 DIAGNOSIS — N186 End stage renal disease: Secondary | ICD-10-CM | POA: Diagnosis not present

## 2018-04-10 DIAGNOSIS — I517 Cardiomegaly: Secondary | ICD-10-CM | POA: Diagnosis not present

## 2018-04-10 DIAGNOSIS — I10 Essential (primary) hypertension: Secondary | ICD-10-CM | POA: Diagnosis not present

## 2018-04-12 DIAGNOSIS — N2581 Secondary hyperparathyroidism of renal origin: Secondary | ICD-10-CM | POA: Diagnosis not present

## 2018-04-12 DIAGNOSIS — N186 End stage renal disease: Secondary | ICD-10-CM | POA: Diagnosis not present

## 2018-04-12 DIAGNOSIS — D631 Anemia in chronic kidney disease: Secondary | ICD-10-CM | POA: Diagnosis not present

## 2018-04-12 DIAGNOSIS — I10 Essential (primary) hypertension: Secondary | ICD-10-CM | POA: Diagnosis not present

## 2018-04-14 DIAGNOSIS — N186 End stage renal disease: Secondary | ICD-10-CM | POA: Diagnosis not present

## 2018-04-14 DIAGNOSIS — D631 Anemia in chronic kidney disease: Secondary | ICD-10-CM | POA: Diagnosis not present

## 2018-04-14 DIAGNOSIS — N2581 Secondary hyperparathyroidism of renal origin: Secondary | ICD-10-CM | POA: Diagnosis not present

## 2018-04-14 DIAGNOSIS — Z114 Encounter for screening for human immunodeficiency virus [HIV]: Secondary | ICD-10-CM | POA: Diagnosis not present

## 2018-04-14 DIAGNOSIS — I10 Essential (primary) hypertension: Secondary | ICD-10-CM | POA: Diagnosis not present

## 2018-04-17 DIAGNOSIS — N2581 Secondary hyperparathyroidism of renal origin: Secondary | ICD-10-CM | POA: Diagnosis not present

## 2018-04-17 DIAGNOSIS — D631 Anemia in chronic kidney disease: Secondary | ICD-10-CM | POA: Diagnosis not present

## 2018-04-17 DIAGNOSIS — N186 End stage renal disease: Secondary | ICD-10-CM | POA: Diagnosis not present

## 2018-04-17 DIAGNOSIS — I10 Essential (primary) hypertension: Secondary | ICD-10-CM | POA: Diagnosis not present

## 2018-04-21 DIAGNOSIS — D631 Anemia in chronic kidney disease: Secondary | ICD-10-CM | POA: Diagnosis not present

## 2018-04-21 DIAGNOSIS — I10 Essential (primary) hypertension: Secondary | ICD-10-CM | POA: Diagnosis not present

## 2018-04-21 DIAGNOSIS — N186 End stage renal disease: Secondary | ICD-10-CM | POA: Diagnosis not present

## 2018-04-21 DIAGNOSIS — N2581 Secondary hyperparathyroidism of renal origin: Secondary | ICD-10-CM | POA: Diagnosis not present

## 2018-04-22 DIAGNOSIS — Z992 Dependence on renal dialysis: Secondary | ICD-10-CM | POA: Diagnosis not present

## 2018-04-22 DIAGNOSIS — N186 End stage renal disease: Secondary | ICD-10-CM | POA: Diagnosis not present

## 2018-04-24 DIAGNOSIS — D631 Anemia in chronic kidney disease: Secondary | ICD-10-CM | POA: Diagnosis not present

## 2018-04-24 DIAGNOSIS — I10 Essential (primary) hypertension: Secondary | ICD-10-CM | POA: Diagnosis not present

## 2018-04-24 DIAGNOSIS — N186 End stage renal disease: Secondary | ICD-10-CM | POA: Diagnosis not present

## 2018-04-24 DIAGNOSIS — N2581 Secondary hyperparathyroidism of renal origin: Secondary | ICD-10-CM | POA: Diagnosis not present

## 2018-04-26 DIAGNOSIS — D631 Anemia in chronic kidney disease: Secondary | ICD-10-CM | POA: Diagnosis not present

## 2018-04-26 DIAGNOSIS — N2581 Secondary hyperparathyroidism of renal origin: Secondary | ICD-10-CM | POA: Diagnosis not present

## 2018-04-26 DIAGNOSIS — I10 Essential (primary) hypertension: Secondary | ICD-10-CM | POA: Diagnosis not present

## 2018-04-26 DIAGNOSIS — N186 End stage renal disease: Secondary | ICD-10-CM | POA: Diagnosis not present

## 2018-04-28 DIAGNOSIS — D631 Anemia in chronic kidney disease: Secondary | ICD-10-CM | POA: Diagnosis not present

## 2018-04-28 DIAGNOSIS — N186 End stage renal disease: Secondary | ICD-10-CM | POA: Diagnosis not present

## 2018-04-28 DIAGNOSIS — N2581 Secondary hyperparathyroidism of renal origin: Secondary | ICD-10-CM | POA: Diagnosis not present

## 2018-04-28 DIAGNOSIS — I10 Essential (primary) hypertension: Secondary | ICD-10-CM | POA: Diagnosis not present

## 2018-05-01 DIAGNOSIS — I10 Essential (primary) hypertension: Secondary | ICD-10-CM | POA: Diagnosis not present

## 2018-05-01 DIAGNOSIS — D631 Anemia in chronic kidney disease: Secondary | ICD-10-CM | POA: Diagnosis not present

## 2018-05-01 DIAGNOSIS — N186 End stage renal disease: Secondary | ICD-10-CM | POA: Diagnosis not present

## 2018-05-01 DIAGNOSIS — N2581 Secondary hyperparathyroidism of renal origin: Secondary | ICD-10-CM | POA: Diagnosis not present

## 2018-05-03 DIAGNOSIS — D631 Anemia in chronic kidney disease: Secondary | ICD-10-CM | POA: Diagnosis not present

## 2018-05-03 DIAGNOSIS — I10 Essential (primary) hypertension: Secondary | ICD-10-CM | POA: Diagnosis not present

## 2018-05-03 DIAGNOSIS — N2581 Secondary hyperparathyroidism of renal origin: Secondary | ICD-10-CM | POA: Diagnosis not present

## 2018-05-03 DIAGNOSIS — N186 End stage renal disease: Secondary | ICD-10-CM | POA: Diagnosis not present

## 2018-05-05 DIAGNOSIS — N2581 Secondary hyperparathyroidism of renal origin: Secondary | ICD-10-CM | POA: Diagnosis not present

## 2018-05-05 DIAGNOSIS — N186 End stage renal disease: Secondary | ICD-10-CM | POA: Diagnosis not present

## 2018-05-05 DIAGNOSIS — I10 Essential (primary) hypertension: Secondary | ICD-10-CM | POA: Diagnosis not present

## 2018-05-05 DIAGNOSIS — D631 Anemia in chronic kidney disease: Secondary | ICD-10-CM | POA: Diagnosis not present

## 2018-05-08 DIAGNOSIS — N186 End stage renal disease: Secondary | ICD-10-CM | POA: Diagnosis not present

## 2018-05-08 DIAGNOSIS — D631 Anemia in chronic kidney disease: Secondary | ICD-10-CM | POA: Diagnosis not present

## 2018-05-08 DIAGNOSIS — N2581 Secondary hyperparathyroidism of renal origin: Secondary | ICD-10-CM | POA: Diagnosis not present

## 2018-05-08 DIAGNOSIS — I10 Essential (primary) hypertension: Secondary | ICD-10-CM | POA: Diagnosis not present

## 2018-05-12 DIAGNOSIS — D631 Anemia in chronic kidney disease: Secondary | ICD-10-CM | POA: Diagnosis not present

## 2018-05-12 DIAGNOSIS — N2581 Secondary hyperparathyroidism of renal origin: Secondary | ICD-10-CM | POA: Diagnosis not present

## 2018-05-12 DIAGNOSIS — I10 Essential (primary) hypertension: Secondary | ICD-10-CM | POA: Diagnosis not present

## 2018-05-12 DIAGNOSIS — N186 End stage renal disease: Secondary | ICD-10-CM | POA: Diagnosis not present

## 2018-05-15 DIAGNOSIS — N2581 Secondary hyperparathyroidism of renal origin: Secondary | ICD-10-CM | POA: Diagnosis not present

## 2018-05-15 DIAGNOSIS — N186 End stage renal disease: Secondary | ICD-10-CM | POA: Diagnosis not present

## 2018-05-15 DIAGNOSIS — I10 Essential (primary) hypertension: Secondary | ICD-10-CM | POA: Diagnosis not present

## 2018-05-15 DIAGNOSIS — D631 Anemia in chronic kidney disease: Secondary | ICD-10-CM | POA: Diagnosis not present

## 2018-05-17 DIAGNOSIS — N186 End stage renal disease: Secondary | ICD-10-CM | POA: Diagnosis not present

## 2018-05-17 DIAGNOSIS — I10 Essential (primary) hypertension: Secondary | ICD-10-CM | POA: Diagnosis not present

## 2018-05-17 DIAGNOSIS — N2581 Secondary hyperparathyroidism of renal origin: Secondary | ICD-10-CM | POA: Diagnosis not present

## 2018-05-17 DIAGNOSIS — D631 Anemia in chronic kidney disease: Secondary | ICD-10-CM | POA: Diagnosis not present

## 2018-05-19 DIAGNOSIS — N2581 Secondary hyperparathyroidism of renal origin: Secondary | ICD-10-CM | POA: Diagnosis not present

## 2018-05-19 DIAGNOSIS — N186 End stage renal disease: Secondary | ICD-10-CM | POA: Diagnosis not present

## 2018-05-19 DIAGNOSIS — D631 Anemia in chronic kidney disease: Secondary | ICD-10-CM | POA: Diagnosis not present

## 2018-05-19 DIAGNOSIS — I10 Essential (primary) hypertension: Secondary | ICD-10-CM | POA: Diagnosis not present

## 2018-05-22 DIAGNOSIS — I10 Essential (primary) hypertension: Secondary | ICD-10-CM | POA: Diagnosis not present

## 2018-05-22 DIAGNOSIS — D631 Anemia in chronic kidney disease: Secondary | ICD-10-CM | POA: Diagnosis not present

## 2018-05-22 DIAGNOSIS — N2581 Secondary hyperparathyroidism of renal origin: Secondary | ICD-10-CM | POA: Diagnosis not present

## 2018-05-22 DIAGNOSIS — N186 End stage renal disease: Secondary | ICD-10-CM | POA: Diagnosis not present

## 2018-05-23 DIAGNOSIS — N186 End stage renal disease: Secondary | ICD-10-CM | POA: Diagnosis not present

## 2018-05-23 DIAGNOSIS — Z992 Dependence on renal dialysis: Secondary | ICD-10-CM | POA: Diagnosis not present

## 2018-05-24 DIAGNOSIS — N186 End stage renal disease: Secondary | ICD-10-CM | POA: Diagnosis not present

## 2018-05-24 DIAGNOSIS — D631 Anemia in chronic kidney disease: Secondary | ICD-10-CM | POA: Diagnosis not present

## 2018-05-24 DIAGNOSIS — N2581 Secondary hyperparathyroidism of renal origin: Secondary | ICD-10-CM | POA: Diagnosis not present

## 2018-05-24 DIAGNOSIS — I10 Essential (primary) hypertension: Secondary | ICD-10-CM | POA: Diagnosis not present

## 2018-05-26 DIAGNOSIS — N186 End stage renal disease: Secondary | ICD-10-CM | POA: Diagnosis not present

## 2018-05-26 DIAGNOSIS — I10 Essential (primary) hypertension: Secondary | ICD-10-CM | POA: Diagnosis not present

## 2018-05-26 DIAGNOSIS — D631 Anemia in chronic kidney disease: Secondary | ICD-10-CM | POA: Diagnosis not present

## 2018-05-26 DIAGNOSIS — N2581 Secondary hyperparathyroidism of renal origin: Secondary | ICD-10-CM | POA: Diagnosis not present

## 2018-05-29 DIAGNOSIS — N2581 Secondary hyperparathyroidism of renal origin: Secondary | ICD-10-CM | POA: Diagnosis not present

## 2018-05-29 DIAGNOSIS — D631 Anemia in chronic kidney disease: Secondary | ICD-10-CM | POA: Diagnosis not present

## 2018-05-29 DIAGNOSIS — N186 End stage renal disease: Secondary | ICD-10-CM | POA: Diagnosis not present

## 2018-05-29 DIAGNOSIS — I10 Essential (primary) hypertension: Secondary | ICD-10-CM | POA: Diagnosis not present

## 2018-05-31 DIAGNOSIS — N2581 Secondary hyperparathyroidism of renal origin: Secondary | ICD-10-CM | POA: Diagnosis not present

## 2018-05-31 DIAGNOSIS — I10 Essential (primary) hypertension: Secondary | ICD-10-CM | POA: Diagnosis not present

## 2018-05-31 DIAGNOSIS — N186 End stage renal disease: Secondary | ICD-10-CM | POA: Diagnosis not present

## 2018-05-31 DIAGNOSIS — D631 Anemia in chronic kidney disease: Secondary | ICD-10-CM | POA: Diagnosis not present

## 2018-06-02 DIAGNOSIS — N2581 Secondary hyperparathyroidism of renal origin: Secondary | ICD-10-CM | POA: Diagnosis not present

## 2018-06-02 DIAGNOSIS — I10 Essential (primary) hypertension: Secondary | ICD-10-CM | POA: Diagnosis not present

## 2018-06-02 DIAGNOSIS — N186 End stage renal disease: Secondary | ICD-10-CM | POA: Diagnosis not present

## 2018-06-02 DIAGNOSIS — D631 Anemia in chronic kidney disease: Secondary | ICD-10-CM | POA: Diagnosis not present

## 2018-06-05 DIAGNOSIS — N2581 Secondary hyperparathyroidism of renal origin: Secondary | ICD-10-CM | POA: Diagnosis not present

## 2018-06-05 DIAGNOSIS — I10 Essential (primary) hypertension: Secondary | ICD-10-CM | POA: Diagnosis not present

## 2018-06-05 DIAGNOSIS — N186 End stage renal disease: Secondary | ICD-10-CM | POA: Diagnosis not present

## 2018-06-05 DIAGNOSIS — D631 Anemia in chronic kidney disease: Secondary | ICD-10-CM | POA: Diagnosis not present

## 2018-06-07 DIAGNOSIS — D631 Anemia in chronic kidney disease: Secondary | ICD-10-CM | POA: Diagnosis not present

## 2018-06-07 DIAGNOSIS — N2581 Secondary hyperparathyroidism of renal origin: Secondary | ICD-10-CM | POA: Diagnosis not present

## 2018-06-07 DIAGNOSIS — N186 End stage renal disease: Secondary | ICD-10-CM | POA: Diagnosis not present

## 2018-06-07 DIAGNOSIS — I10 Essential (primary) hypertension: Secondary | ICD-10-CM | POA: Diagnosis not present

## 2018-06-09 DIAGNOSIS — I10 Essential (primary) hypertension: Secondary | ICD-10-CM | POA: Diagnosis not present

## 2018-06-09 DIAGNOSIS — D631 Anemia in chronic kidney disease: Secondary | ICD-10-CM | POA: Diagnosis not present

## 2018-06-09 DIAGNOSIS — N186 End stage renal disease: Secondary | ICD-10-CM | POA: Diagnosis not present

## 2018-06-09 DIAGNOSIS — N2581 Secondary hyperparathyroidism of renal origin: Secondary | ICD-10-CM | POA: Diagnosis not present

## 2018-06-12 DIAGNOSIS — D631 Anemia in chronic kidney disease: Secondary | ICD-10-CM | POA: Diagnosis not present

## 2018-06-12 DIAGNOSIS — N2581 Secondary hyperparathyroidism of renal origin: Secondary | ICD-10-CM | POA: Diagnosis not present

## 2018-06-12 DIAGNOSIS — N186 End stage renal disease: Secondary | ICD-10-CM | POA: Diagnosis not present

## 2018-06-12 DIAGNOSIS — I10 Essential (primary) hypertension: Secondary | ICD-10-CM | POA: Diagnosis not present

## 2018-06-14 DIAGNOSIS — D631 Anemia in chronic kidney disease: Secondary | ICD-10-CM | POA: Diagnosis not present

## 2018-06-14 DIAGNOSIS — N186 End stage renal disease: Secondary | ICD-10-CM | POA: Diagnosis not present

## 2018-06-14 DIAGNOSIS — N2581 Secondary hyperparathyroidism of renal origin: Secondary | ICD-10-CM | POA: Diagnosis not present

## 2018-06-14 DIAGNOSIS — I10 Essential (primary) hypertension: Secondary | ICD-10-CM | POA: Diagnosis not present

## 2018-06-16 DIAGNOSIS — N2581 Secondary hyperparathyroidism of renal origin: Secondary | ICD-10-CM | POA: Diagnosis not present

## 2018-06-16 DIAGNOSIS — D631 Anemia in chronic kidney disease: Secondary | ICD-10-CM | POA: Diagnosis not present

## 2018-06-16 DIAGNOSIS — N186 End stage renal disease: Secondary | ICD-10-CM | POA: Diagnosis not present

## 2018-06-16 DIAGNOSIS — I10 Essential (primary) hypertension: Secondary | ICD-10-CM | POA: Diagnosis not present

## 2018-06-19 DIAGNOSIS — N186 End stage renal disease: Secondary | ICD-10-CM | POA: Diagnosis not present

## 2018-06-19 DIAGNOSIS — I10 Essential (primary) hypertension: Secondary | ICD-10-CM | POA: Diagnosis not present

## 2018-06-19 DIAGNOSIS — D631 Anemia in chronic kidney disease: Secondary | ICD-10-CM | POA: Diagnosis not present

## 2018-06-19 DIAGNOSIS — N2581 Secondary hyperparathyroidism of renal origin: Secondary | ICD-10-CM | POA: Diagnosis not present

## 2018-06-21 DIAGNOSIS — D631 Anemia in chronic kidney disease: Secondary | ICD-10-CM | POA: Diagnosis not present

## 2018-06-21 DIAGNOSIS — N2581 Secondary hyperparathyroidism of renal origin: Secondary | ICD-10-CM | POA: Diagnosis not present

## 2018-06-21 DIAGNOSIS — I10 Essential (primary) hypertension: Secondary | ICD-10-CM | POA: Diagnosis not present

## 2018-06-21 DIAGNOSIS — N186 End stage renal disease: Secondary | ICD-10-CM | POA: Diagnosis not present

## 2018-06-22 DIAGNOSIS — N186 End stage renal disease: Secondary | ICD-10-CM | POA: Diagnosis not present

## 2018-06-22 DIAGNOSIS — Z992 Dependence on renal dialysis: Secondary | ICD-10-CM | POA: Diagnosis not present

## 2018-06-23 DIAGNOSIS — N2581 Secondary hyperparathyroidism of renal origin: Secondary | ICD-10-CM | POA: Diagnosis not present

## 2018-06-23 DIAGNOSIS — D631 Anemia in chronic kidney disease: Secondary | ICD-10-CM | POA: Diagnosis not present

## 2018-06-23 DIAGNOSIS — N186 End stage renal disease: Secondary | ICD-10-CM | POA: Diagnosis not present

## 2018-06-26 DIAGNOSIS — N2581 Secondary hyperparathyroidism of renal origin: Secondary | ICD-10-CM | POA: Diagnosis not present

## 2018-06-26 DIAGNOSIS — D631 Anemia in chronic kidney disease: Secondary | ICD-10-CM | POA: Diagnosis not present

## 2018-06-26 DIAGNOSIS — N186 End stage renal disease: Secondary | ICD-10-CM | POA: Diagnosis not present

## 2018-06-28 DIAGNOSIS — N186 End stage renal disease: Secondary | ICD-10-CM | POA: Diagnosis not present

## 2018-06-28 DIAGNOSIS — D631 Anemia in chronic kidney disease: Secondary | ICD-10-CM | POA: Diagnosis not present

## 2018-06-28 DIAGNOSIS — N2581 Secondary hyperparathyroidism of renal origin: Secondary | ICD-10-CM | POA: Diagnosis not present

## 2018-06-30 DIAGNOSIS — N2581 Secondary hyperparathyroidism of renal origin: Secondary | ICD-10-CM | POA: Diagnosis not present

## 2018-06-30 DIAGNOSIS — D631 Anemia in chronic kidney disease: Secondary | ICD-10-CM | POA: Diagnosis not present

## 2018-06-30 DIAGNOSIS — N186 End stage renal disease: Secondary | ICD-10-CM | POA: Diagnosis not present

## 2018-07-03 DIAGNOSIS — N2581 Secondary hyperparathyroidism of renal origin: Secondary | ICD-10-CM | POA: Diagnosis not present

## 2018-07-03 DIAGNOSIS — N186 End stage renal disease: Secondary | ICD-10-CM | POA: Diagnosis not present

## 2018-07-03 DIAGNOSIS — D631 Anemia in chronic kidney disease: Secondary | ICD-10-CM | POA: Diagnosis not present

## 2018-07-05 DIAGNOSIS — N186 End stage renal disease: Secondary | ICD-10-CM | POA: Diagnosis not present

## 2018-07-05 DIAGNOSIS — D631 Anemia in chronic kidney disease: Secondary | ICD-10-CM | POA: Diagnosis not present

## 2018-07-05 DIAGNOSIS — N2581 Secondary hyperparathyroidism of renal origin: Secondary | ICD-10-CM | POA: Diagnosis not present

## 2018-07-07 DIAGNOSIS — N2581 Secondary hyperparathyroidism of renal origin: Secondary | ICD-10-CM | POA: Diagnosis not present

## 2018-07-07 DIAGNOSIS — N186 End stage renal disease: Secondary | ICD-10-CM | POA: Diagnosis not present

## 2018-07-07 DIAGNOSIS — D631 Anemia in chronic kidney disease: Secondary | ICD-10-CM | POA: Diagnosis not present

## 2018-07-10 DIAGNOSIS — N2581 Secondary hyperparathyroidism of renal origin: Secondary | ICD-10-CM | POA: Diagnosis not present

## 2018-07-10 DIAGNOSIS — D631 Anemia in chronic kidney disease: Secondary | ICD-10-CM | POA: Diagnosis not present

## 2018-07-10 DIAGNOSIS — N186 End stage renal disease: Secondary | ICD-10-CM | POA: Diagnosis not present

## 2018-07-14 DIAGNOSIS — R0989 Other specified symptoms and signs involving the circulatory and respiratory systems: Secondary | ICD-10-CM | POA: Diagnosis not present

## 2018-07-14 DIAGNOSIS — I10 Essential (primary) hypertension: Secondary | ICD-10-CM | POA: Diagnosis not present

## 2018-07-14 DIAGNOSIS — R9431 Abnormal electrocardiogram [ECG] [EKG]: Secondary | ICD-10-CM | POA: Diagnosis not present

## 2018-07-14 DIAGNOSIS — D649 Anemia, unspecified: Secondary | ICD-10-CM | POA: Diagnosis not present

## 2018-07-14 DIAGNOSIS — Z7901 Long term (current) use of anticoagulants: Secondary | ICD-10-CM | POA: Diagnosis not present

## 2018-07-14 DIAGNOSIS — R002 Palpitations: Secondary | ICD-10-CM | POA: Diagnosis not present

## 2018-07-14 DIAGNOSIS — N2581 Secondary hyperparathyroidism of renal origin: Secondary | ICD-10-CM | POA: Diagnosis not present

## 2018-07-14 DIAGNOSIS — R0602 Shortness of breath: Secondary | ICD-10-CM | POA: Diagnosis not present

## 2018-07-14 DIAGNOSIS — R000 Tachycardia, unspecified: Secondary | ICD-10-CM | POA: Diagnosis not present

## 2018-07-14 DIAGNOSIS — I132 Hypertensive heart and chronic kidney disease with heart failure and with stage 5 chronic kidney disease, or end stage renal disease: Secondary | ICD-10-CM | POA: Diagnosis not present

## 2018-07-14 DIAGNOSIS — N186 End stage renal disease: Secondary | ICD-10-CM | POA: Diagnosis not present

## 2018-07-14 DIAGNOSIS — Z992 Dependence on renal dialysis: Secondary | ICD-10-CM | POA: Diagnosis not present

## 2018-07-14 DIAGNOSIS — I480 Paroxysmal atrial fibrillation: Secondary | ICD-10-CM | POA: Diagnosis not present

## 2018-07-14 DIAGNOSIS — I4891 Unspecified atrial fibrillation: Secondary | ICD-10-CM | POA: Diagnosis not present

## 2018-07-14 DIAGNOSIS — I5021 Acute systolic (congestive) heart failure: Secondary | ICD-10-CM | POA: Diagnosis not present

## 2018-07-14 DIAGNOSIS — I1311 Hypertensive heart and chronic kidney disease without heart failure, with stage 5 chronic kidney disease, or end stage renal disease: Secondary | ICD-10-CM | POA: Diagnosis not present

## 2018-07-14 DIAGNOSIS — D631 Anemia in chronic kidney disease: Secondary | ICD-10-CM | POA: Diagnosis not present

## 2018-07-17 DIAGNOSIS — N2581 Secondary hyperparathyroidism of renal origin: Secondary | ICD-10-CM | POA: Diagnosis not present

## 2018-07-17 DIAGNOSIS — D631 Anemia in chronic kidney disease: Secondary | ICD-10-CM | POA: Diagnosis not present

## 2018-07-17 DIAGNOSIS — N186 End stage renal disease: Secondary | ICD-10-CM | POA: Diagnosis not present

## 2018-07-19 DIAGNOSIS — N186 End stage renal disease: Secondary | ICD-10-CM | POA: Diagnosis not present

## 2018-07-19 DIAGNOSIS — N2581 Secondary hyperparathyroidism of renal origin: Secondary | ICD-10-CM | POA: Diagnosis not present

## 2018-07-19 DIAGNOSIS — D631 Anemia in chronic kidney disease: Secondary | ICD-10-CM | POA: Diagnosis not present

## 2018-07-21 DIAGNOSIS — N186 End stage renal disease: Secondary | ICD-10-CM | POA: Diagnosis not present

## 2018-07-21 DIAGNOSIS — D631 Anemia in chronic kidney disease: Secondary | ICD-10-CM | POA: Diagnosis not present

## 2018-07-21 DIAGNOSIS — N2581 Secondary hyperparathyroidism of renal origin: Secondary | ICD-10-CM | POA: Diagnosis not present

## 2018-07-23 DIAGNOSIS — N186 End stage renal disease: Secondary | ICD-10-CM | POA: Diagnosis not present

## 2018-07-23 DIAGNOSIS — Z992 Dependence on renal dialysis: Secondary | ICD-10-CM | POA: Diagnosis not present

## 2018-07-24 DIAGNOSIS — D631 Anemia in chronic kidney disease: Secondary | ICD-10-CM | POA: Diagnosis not present

## 2018-07-24 DIAGNOSIS — N2581 Secondary hyperparathyroidism of renal origin: Secondary | ICD-10-CM | POA: Diagnosis not present

## 2018-07-24 DIAGNOSIS — N186 End stage renal disease: Secondary | ICD-10-CM | POA: Diagnosis not present

## 2018-07-26 DIAGNOSIS — D631 Anemia in chronic kidney disease: Secondary | ICD-10-CM | POA: Diagnosis not present

## 2018-07-26 DIAGNOSIS — N186 End stage renal disease: Secondary | ICD-10-CM | POA: Diagnosis not present

## 2018-07-26 DIAGNOSIS — N2581 Secondary hyperparathyroidism of renal origin: Secondary | ICD-10-CM | POA: Diagnosis not present

## 2018-07-28 DIAGNOSIS — N2581 Secondary hyperparathyroidism of renal origin: Secondary | ICD-10-CM | POA: Diagnosis not present

## 2018-07-28 DIAGNOSIS — D631 Anemia in chronic kidney disease: Secondary | ICD-10-CM | POA: Diagnosis not present

## 2018-07-28 DIAGNOSIS — N186 End stage renal disease: Secondary | ICD-10-CM | POA: Diagnosis not present

## 2018-07-31 DIAGNOSIS — N186 End stage renal disease: Secondary | ICD-10-CM | POA: Diagnosis not present

## 2018-07-31 DIAGNOSIS — N2581 Secondary hyperparathyroidism of renal origin: Secondary | ICD-10-CM | POA: Diagnosis not present

## 2018-07-31 DIAGNOSIS — D631 Anemia in chronic kidney disease: Secondary | ICD-10-CM | POA: Diagnosis not present

## 2018-08-01 DIAGNOSIS — N186 End stage renal disease: Secondary | ICD-10-CM | POA: Diagnosis not present

## 2018-08-01 DIAGNOSIS — Z992 Dependence on renal dialysis: Secondary | ICD-10-CM | POA: Diagnosis not present

## 2018-08-02 DIAGNOSIS — D631 Anemia in chronic kidney disease: Secondary | ICD-10-CM | POA: Diagnosis not present

## 2018-08-02 DIAGNOSIS — N186 End stage renal disease: Secondary | ICD-10-CM | POA: Diagnosis not present

## 2018-08-02 DIAGNOSIS — N2581 Secondary hyperparathyroidism of renal origin: Secondary | ICD-10-CM | POA: Diagnosis not present

## 2018-08-04 DIAGNOSIS — N2581 Secondary hyperparathyroidism of renal origin: Secondary | ICD-10-CM | POA: Diagnosis not present

## 2018-08-04 DIAGNOSIS — N186 End stage renal disease: Secondary | ICD-10-CM | POA: Diagnosis not present

## 2018-08-04 DIAGNOSIS — D631 Anemia in chronic kidney disease: Secondary | ICD-10-CM | POA: Diagnosis not present

## 2018-08-07 DIAGNOSIS — D631 Anemia in chronic kidney disease: Secondary | ICD-10-CM | POA: Diagnosis not present

## 2018-08-07 DIAGNOSIS — N2581 Secondary hyperparathyroidism of renal origin: Secondary | ICD-10-CM | POA: Diagnosis not present

## 2018-08-07 DIAGNOSIS — N186 End stage renal disease: Secondary | ICD-10-CM | POA: Diagnosis not present

## 2018-08-09 DIAGNOSIS — N2581 Secondary hyperparathyroidism of renal origin: Secondary | ICD-10-CM | POA: Diagnosis not present

## 2018-08-09 DIAGNOSIS — N186 End stage renal disease: Secondary | ICD-10-CM | POA: Diagnosis not present

## 2018-08-09 DIAGNOSIS — D631 Anemia in chronic kidney disease: Secondary | ICD-10-CM | POA: Diagnosis not present

## 2018-08-11 DIAGNOSIS — D631 Anemia in chronic kidney disease: Secondary | ICD-10-CM | POA: Diagnosis not present

## 2018-08-11 DIAGNOSIS — N186 End stage renal disease: Secondary | ICD-10-CM | POA: Diagnosis not present

## 2018-08-11 DIAGNOSIS — N2581 Secondary hyperparathyroidism of renal origin: Secondary | ICD-10-CM | POA: Diagnosis not present

## 2018-08-14 DIAGNOSIS — D631 Anemia in chronic kidney disease: Secondary | ICD-10-CM | POA: Diagnosis not present

## 2018-08-14 DIAGNOSIS — N186 End stage renal disease: Secondary | ICD-10-CM | POA: Diagnosis not present

## 2018-08-14 DIAGNOSIS — N2581 Secondary hyperparathyroidism of renal origin: Secondary | ICD-10-CM | POA: Diagnosis not present

## 2018-08-16 DIAGNOSIS — N186 End stage renal disease: Secondary | ICD-10-CM | POA: Diagnosis not present

## 2018-08-16 DIAGNOSIS — N2581 Secondary hyperparathyroidism of renal origin: Secondary | ICD-10-CM | POA: Diagnosis not present

## 2018-08-16 DIAGNOSIS — D631 Anemia in chronic kidney disease: Secondary | ICD-10-CM | POA: Diagnosis not present

## 2018-08-18 DIAGNOSIS — N2581 Secondary hyperparathyroidism of renal origin: Secondary | ICD-10-CM | POA: Diagnosis not present

## 2018-08-18 DIAGNOSIS — D631 Anemia in chronic kidney disease: Secondary | ICD-10-CM | POA: Diagnosis not present

## 2018-08-18 DIAGNOSIS — N186 End stage renal disease: Secondary | ICD-10-CM | POA: Diagnosis not present

## 2018-08-21 DIAGNOSIS — N186 End stage renal disease: Secondary | ICD-10-CM | POA: Diagnosis not present

## 2018-08-21 DIAGNOSIS — D631 Anemia in chronic kidney disease: Secondary | ICD-10-CM | POA: Diagnosis not present

## 2018-08-21 DIAGNOSIS — N2581 Secondary hyperparathyroidism of renal origin: Secondary | ICD-10-CM | POA: Diagnosis not present

## 2018-08-22 DIAGNOSIS — Z992 Dependence on renal dialysis: Secondary | ICD-10-CM | POA: Diagnosis not present

## 2018-08-22 DIAGNOSIS — N186 End stage renal disease: Secondary | ICD-10-CM | POA: Diagnosis not present

## 2018-08-23 DIAGNOSIS — N2581 Secondary hyperparathyroidism of renal origin: Secondary | ICD-10-CM | POA: Diagnosis not present

## 2018-08-23 DIAGNOSIS — D631 Anemia in chronic kidney disease: Secondary | ICD-10-CM | POA: Diagnosis not present

## 2018-08-23 DIAGNOSIS — D509 Iron deficiency anemia, unspecified: Secondary | ICD-10-CM | POA: Diagnosis not present

## 2018-08-23 DIAGNOSIS — N186 End stage renal disease: Secondary | ICD-10-CM | POA: Diagnosis not present

## 2018-08-25 DIAGNOSIS — D631 Anemia in chronic kidney disease: Secondary | ICD-10-CM | POA: Diagnosis not present

## 2018-08-25 DIAGNOSIS — N186 End stage renal disease: Secondary | ICD-10-CM | POA: Diagnosis not present

## 2018-08-25 DIAGNOSIS — D509 Iron deficiency anemia, unspecified: Secondary | ICD-10-CM | POA: Diagnosis not present

## 2018-08-25 DIAGNOSIS — N2581 Secondary hyperparathyroidism of renal origin: Secondary | ICD-10-CM | POA: Diagnosis not present

## 2018-08-28 DIAGNOSIS — D509 Iron deficiency anemia, unspecified: Secondary | ICD-10-CM | POA: Diagnosis not present

## 2018-08-28 DIAGNOSIS — N2581 Secondary hyperparathyroidism of renal origin: Secondary | ICD-10-CM | POA: Diagnosis not present

## 2018-08-28 DIAGNOSIS — N186 End stage renal disease: Secondary | ICD-10-CM | POA: Diagnosis not present

## 2018-08-28 DIAGNOSIS — D631 Anemia in chronic kidney disease: Secondary | ICD-10-CM | POA: Diagnosis not present

## 2018-08-30 DIAGNOSIS — N186 End stage renal disease: Secondary | ICD-10-CM | POA: Diagnosis not present

## 2018-08-30 DIAGNOSIS — D509 Iron deficiency anemia, unspecified: Secondary | ICD-10-CM | POA: Diagnosis not present

## 2018-08-30 DIAGNOSIS — N2581 Secondary hyperparathyroidism of renal origin: Secondary | ICD-10-CM | POA: Diagnosis not present

## 2018-08-30 DIAGNOSIS — D631 Anemia in chronic kidney disease: Secondary | ICD-10-CM | POA: Diagnosis not present

## 2018-09-01 DIAGNOSIS — N186 End stage renal disease: Secondary | ICD-10-CM | POA: Diagnosis not present

## 2018-09-01 DIAGNOSIS — D509 Iron deficiency anemia, unspecified: Secondary | ICD-10-CM | POA: Diagnosis not present

## 2018-09-01 DIAGNOSIS — D631 Anemia in chronic kidney disease: Secondary | ICD-10-CM | POA: Diagnosis not present

## 2018-09-01 DIAGNOSIS — N2581 Secondary hyperparathyroidism of renal origin: Secondary | ICD-10-CM | POA: Diagnosis not present

## 2018-09-04 DIAGNOSIS — N2581 Secondary hyperparathyroidism of renal origin: Secondary | ICD-10-CM | POA: Diagnosis not present

## 2018-09-04 DIAGNOSIS — D631 Anemia in chronic kidney disease: Secondary | ICD-10-CM | POA: Diagnosis not present

## 2018-09-04 DIAGNOSIS — N186 End stage renal disease: Secondary | ICD-10-CM | POA: Diagnosis not present

## 2018-09-04 DIAGNOSIS — D509 Iron deficiency anemia, unspecified: Secondary | ICD-10-CM | POA: Diagnosis not present

## 2018-09-06 DIAGNOSIS — D509 Iron deficiency anemia, unspecified: Secondary | ICD-10-CM | POA: Diagnosis not present

## 2018-09-06 DIAGNOSIS — N186 End stage renal disease: Secondary | ICD-10-CM | POA: Diagnosis not present

## 2018-09-06 DIAGNOSIS — D631 Anemia in chronic kidney disease: Secondary | ICD-10-CM | POA: Diagnosis not present

## 2018-09-06 DIAGNOSIS — N2581 Secondary hyperparathyroidism of renal origin: Secondary | ICD-10-CM | POA: Diagnosis not present

## 2018-09-08 DIAGNOSIS — N186 End stage renal disease: Secondary | ICD-10-CM | POA: Diagnosis not present

## 2018-09-08 DIAGNOSIS — D509 Iron deficiency anemia, unspecified: Secondary | ICD-10-CM | POA: Diagnosis not present

## 2018-09-08 DIAGNOSIS — N2581 Secondary hyperparathyroidism of renal origin: Secondary | ICD-10-CM | POA: Diagnosis not present

## 2018-09-08 DIAGNOSIS — D631 Anemia in chronic kidney disease: Secondary | ICD-10-CM | POA: Diagnosis not present

## 2018-09-11 DIAGNOSIS — N186 End stage renal disease: Secondary | ICD-10-CM | POA: Diagnosis not present

## 2018-09-11 DIAGNOSIS — D509 Iron deficiency anemia, unspecified: Secondary | ICD-10-CM | POA: Diagnosis not present

## 2018-09-11 DIAGNOSIS — D631 Anemia in chronic kidney disease: Secondary | ICD-10-CM | POA: Diagnosis not present

## 2018-09-11 DIAGNOSIS — N2581 Secondary hyperparathyroidism of renal origin: Secondary | ICD-10-CM | POA: Diagnosis not present

## 2018-09-13 DIAGNOSIS — D509 Iron deficiency anemia, unspecified: Secondary | ICD-10-CM | POA: Diagnosis not present

## 2018-09-13 DIAGNOSIS — N2581 Secondary hyperparathyroidism of renal origin: Secondary | ICD-10-CM | POA: Diagnosis not present

## 2018-09-13 DIAGNOSIS — D631 Anemia in chronic kidney disease: Secondary | ICD-10-CM | POA: Diagnosis not present

## 2018-09-13 DIAGNOSIS — N186 End stage renal disease: Secondary | ICD-10-CM | POA: Diagnosis not present

## 2018-09-15 DIAGNOSIS — N186 End stage renal disease: Secondary | ICD-10-CM | POA: Diagnosis not present

## 2018-09-15 DIAGNOSIS — D631 Anemia in chronic kidney disease: Secondary | ICD-10-CM | POA: Diagnosis not present

## 2018-09-15 DIAGNOSIS — N2581 Secondary hyperparathyroidism of renal origin: Secondary | ICD-10-CM | POA: Diagnosis not present

## 2018-09-15 DIAGNOSIS — D509 Iron deficiency anemia, unspecified: Secondary | ICD-10-CM | POA: Diagnosis not present

## 2018-09-18 DIAGNOSIS — D509 Iron deficiency anemia, unspecified: Secondary | ICD-10-CM | POA: Diagnosis not present

## 2018-09-18 DIAGNOSIS — N186 End stage renal disease: Secondary | ICD-10-CM | POA: Diagnosis not present

## 2018-09-18 DIAGNOSIS — D631 Anemia in chronic kidney disease: Secondary | ICD-10-CM | POA: Diagnosis not present

## 2018-09-18 DIAGNOSIS — N2581 Secondary hyperparathyroidism of renal origin: Secondary | ICD-10-CM | POA: Diagnosis not present

## 2018-09-20 DIAGNOSIS — D509 Iron deficiency anemia, unspecified: Secondary | ICD-10-CM | POA: Diagnosis not present

## 2018-09-20 DIAGNOSIS — N186 End stage renal disease: Secondary | ICD-10-CM | POA: Diagnosis not present

## 2018-09-20 DIAGNOSIS — N2581 Secondary hyperparathyroidism of renal origin: Secondary | ICD-10-CM | POA: Diagnosis not present

## 2018-09-20 DIAGNOSIS — D631 Anemia in chronic kidney disease: Secondary | ICD-10-CM | POA: Diagnosis not present

## 2018-09-22 DIAGNOSIS — D631 Anemia in chronic kidney disease: Secondary | ICD-10-CM | POA: Diagnosis not present

## 2018-09-22 DIAGNOSIS — N2581 Secondary hyperparathyroidism of renal origin: Secondary | ICD-10-CM | POA: Diagnosis not present

## 2018-09-22 DIAGNOSIS — Z992 Dependence on renal dialysis: Secondary | ICD-10-CM | POA: Diagnosis not present

## 2018-09-22 DIAGNOSIS — D509 Iron deficiency anemia, unspecified: Secondary | ICD-10-CM | POA: Diagnosis not present

## 2018-09-22 DIAGNOSIS — N186 End stage renal disease: Secondary | ICD-10-CM | POA: Diagnosis not present

## 2018-09-25 DIAGNOSIS — N2581 Secondary hyperparathyroidism of renal origin: Secondary | ICD-10-CM | POA: Diagnosis not present

## 2018-09-25 DIAGNOSIS — D509 Iron deficiency anemia, unspecified: Secondary | ICD-10-CM | POA: Diagnosis not present

## 2018-09-25 DIAGNOSIS — N186 End stage renal disease: Secondary | ICD-10-CM | POA: Diagnosis not present

## 2018-09-25 DIAGNOSIS — D631 Anemia in chronic kidney disease: Secondary | ICD-10-CM | POA: Diagnosis not present

## 2018-09-27 DIAGNOSIS — D631 Anemia in chronic kidney disease: Secondary | ICD-10-CM | POA: Diagnosis not present

## 2018-09-27 DIAGNOSIS — N186 End stage renal disease: Secondary | ICD-10-CM | POA: Diagnosis not present

## 2018-09-27 DIAGNOSIS — D509 Iron deficiency anemia, unspecified: Secondary | ICD-10-CM | POA: Diagnosis not present

## 2018-09-27 DIAGNOSIS — N2581 Secondary hyperparathyroidism of renal origin: Secondary | ICD-10-CM | POA: Diagnosis not present

## 2018-09-29 DIAGNOSIS — D509 Iron deficiency anemia, unspecified: Secondary | ICD-10-CM | POA: Diagnosis not present

## 2018-09-29 DIAGNOSIS — N186 End stage renal disease: Secondary | ICD-10-CM | POA: Diagnosis not present

## 2018-09-29 DIAGNOSIS — D631 Anemia in chronic kidney disease: Secondary | ICD-10-CM | POA: Diagnosis not present

## 2018-09-29 DIAGNOSIS — N2581 Secondary hyperparathyroidism of renal origin: Secondary | ICD-10-CM | POA: Diagnosis not present

## 2018-10-02 DIAGNOSIS — D631 Anemia in chronic kidney disease: Secondary | ICD-10-CM | POA: Diagnosis not present

## 2018-10-02 DIAGNOSIS — N186 End stage renal disease: Secondary | ICD-10-CM | POA: Diagnosis not present

## 2018-10-02 DIAGNOSIS — N2581 Secondary hyperparathyroidism of renal origin: Secondary | ICD-10-CM | POA: Diagnosis not present

## 2018-10-02 DIAGNOSIS — D509 Iron deficiency anemia, unspecified: Secondary | ICD-10-CM | POA: Diagnosis not present

## 2018-10-06 DIAGNOSIS — N186 End stage renal disease: Secondary | ICD-10-CM | POA: Diagnosis not present

## 2018-10-06 DIAGNOSIS — D509 Iron deficiency anemia, unspecified: Secondary | ICD-10-CM | POA: Diagnosis not present

## 2018-10-06 DIAGNOSIS — D631 Anemia in chronic kidney disease: Secondary | ICD-10-CM | POA: Diagnosis not present

## 2018-10-06 DIAGNOSIS — N2581 Secondary hyperparathyroidism of renal origin: Secondary | ICD-10-CM | POA: Diagnosis not present

## 2018-10-09 DIAGNOSIS — N2581 Secondary hyperparathyroidism of renal origin: Secondary | ICD-10-CM | POA: Diagnosis not present

## 2018-10-09 DIAGNOSIS — D631 Anemia in chronic kidney disease: Secondary | ICD-10-CM | POA: Diagnosis not present

## 2018-10-09 DIAGNOSIS — D509 Iron deficiency anemia, unspecified: Secondary | ICD-10-CM | POA: Diagnosis not present

## 2018-10-09 DIAGNOSIS — N186 End stage renal disease: Secondary | ICD-10-CM | POA: Diagnosis not present

## 2018-10-11 DIAGNOSIS — N2581 Secondary hyperparathyroidism of renal origin: Secondary | ICD-10-CM | POA: Diagnosis not present

## 2018-10-11 DIAGNOSIS — N186 End stage renal disease: Secondary | ICD-10-CM | POA: Diagnosis not present

## 2018-10-11 DIAGNOSIS — D631 Anemia in chronic kidney disease: Secondary | ICD-10-CM | POA: Diagnosis not present

## 2018-10-11 DIAGNOSIS — D509 Iron deficiency anemia, unspecified: Secondary | ICD-10-CM | POA: Diagnosis not present

## 2018-10-13 DIAGNOSIS — N2581 Secondary hyperparathyroidism of renal origin: Secondary | ICD-10-CM | POA: Diagnosis not present

## 2018-10-13 DIAGNOSIS — N186 End stage renal disease: Secondary | ICD-10-CM | POA: Diagnosis not present

## 2018-10-13 DIAGNOSIS — D631 Anemia in chronic kidney disease: Secondary | ICD-10-CM | POA: Diagnosis not present

## 2018-10-13 DIAGNOSIS — D509 Iron deficiency anemia, unspecified: Secondary | ICD-10-CM | POA: Diagnosis not present

## 2018-10-16 DIAGNOSIS — N186 End stage renal disease: Secondary | ICD-10-CM | POA: Diagnosis not present

## 2018-10-16 DIAGNOSIS — N2581 Secondary hyperparathyroidism of renal origin: Secondary | ICD-10-CM | POA: Diagnosis not present

## 2018-10-16 DIAGNOSIS — Z20828 Contact with and (suspected) exposure to other viral communicable diseases: Secondary | ICD-10-CM | POA: Diagnosis not present

## 2018-10-16 DIAGNOSIS — D509 Iron deficiency anemia, unspecified: Secondary | ICD-10-CM | POA: Diagnosis not present

## 2018-10-16 DIAGNOSIS — Z01812 Encounter for preprocedural laboratory examination: Secondary | ICD-10-CM | POA: Diagnosis not present

## 2018-10-16 DIAGNOSIS — D631 Anemia in chronic kidney disease: Secondary | ICD-10-CM | POA: Diagnosis not present

## 2018-10-18 DIAGNOSIS — D631 Anemia in chronic kidney disease: Secondary | ICD-10-CM | POA: Diagnosis not present

## 2018-10-18 DIAGNOSIS — N2581 Secondary hyperparathyroidism of renal origin: Secondary | ICD-10-CM | POA: Diagnosis not present

## 2018-10-18 DIAGNOSIS — D509 Iron deficiency anemia, unspecified: Secondary | ICD-10-CM | POA: Diagnosis not present

## 2018-10-18 DIAGNOSIS — N186 End stage renal disease: Secondary | ICD-10-CM | POA: Diagnosis not present

## 2018-10-20 DIAGNOSIS — N186 End stage renal disease: Secondary | ICD-10-CM | POA: Diagnosis not present

## 2018-10-20 DIAGNOSIS — D631 Anemia in chronic kidney disease: Secondary | ICD-10-CM | POA: Diagnosis not present

## 2018-10-20 DIAGNOSIS — D509 Iron deficiency anemia, unspecified: Secondary | ICD-10-CM | POA: Diagnosis not present

## 2018-10-20 DIAGNOSIS — N2581 Secondary hyperparathyroidism of renal origin: Secondary | ICD-10-CM | POA: Diagnosis not present

## 2018-10-23 DIAGNOSIS — D631 Anemia in chronic kidney disease: Secondary | ICD-10-CM | POA: Diagnosis not present

## 2018-10-23 DIAGNOSIS — Z992 Dependence on renal dialysis: Secondary | ICD-10-CM | POA: Diagnosis not present

## 2018-10-23 DIAGNOSIS — D509 Iron deficiency anemia, unspecified: Secondary | ICD-10-CM | POA: Diagnosis not present

## 2018-10-23 DIAGNOSIS — N186 End stage renal disease: Secondary | ICD-10-CM | POA: Diagnosis not present

## 2018-10-23 DIAGNOSIS — N2581 Secondary hyperparathyroidism of renal origin: Secondary | ICD-10-CM | POA: Diagnosis not present

## 2018-10-27 DIAGNOSIS — N2581 Secondary hyperparathyroidism of renal origin: Secondary | ICD-10-CM | POA: Diagnosis not present

## 2018-10-27 DIAGNOSIS — N186 End stage renal disease: Secondary | ICD-10-CM | POA: Diagnosis not present

## 2018-10-27 DIAGNOSIS — D509 Iron deficiency anemia, unspecified: Secondary | ICD-10-CM | POA: Diagnosis not present

## 2018-10-27 DIAGNOSIS — D631 Anemia in chronic kidney disease: Secondary | ICD-10-CM | POA: Diagnosis not present

## 2018-10-30 DIAGNOSIS — D631 Anemia in chronic kidney disease: Secondary | ICD-10-CM | POA: Diagnosis not present

## 2018-10-30 DIAGNOSIS — N2581 Secondary hyperparathyroidism of renal origin: Secondary | ICD-10-CM | POA: Diagnosis not present

## 2018-10-30 DIAGNOSIS — D509 Iron deficiency anemia, unspecified: Secondary | ICD-10-CM | POA: Diagnosis not present

## 2018-10-30 DIAGNOSIS — N186 End stage renal disease: Secondary | ICD-10-CM | POA: Diagnosis not present

## 2018-11-01 DIAGNOSIS — N2581 Secondary hyperparathyroidism of renal origin: Secondary | ICD-10-CM | POA: Diagnosis not present

## 2018-11-01 DIAGNOSIS — D631 Anemia in chronic kidney disease: Secondary | ICD-10-CM | POA: Diagnosis not present

## 2018-11-01 DIAGNOSIS — N186 End stage renal disease: Secondary | ICD-10-CM | POA: Diagnosis not present

## 2018-11-01 DIAGNOSIS — D509 Iron deficiency anemia, unspecified: Secondary | ICD-10-CM | POA: Diagnosis not present

## 2018-11-03 DIAGNOSIS — D631 Anemia in chronic kidney disease: Secondary | ICD-10-CM | POA: Diagnosis not present

## 2018-11-03 DIAGNOSIS — N186 End stage renal disease: Secondary | ICD-10-CM | POA: Diagnosis not present

## 2018-11-03 DIAGNOSIS — N2581 Secondary hyperparathyroidism of renal origin: Secondary | ICD-10-CM | POA: Diagnosis not present

## 2018-11-03 DIAGNOSIS — D509 Iron deficiency anemia, unspecified: Secondary | ICD-10-CM | POA: Diagnosis not present

## 2018-11-06 DIAGNOSIS — D631 Anemia in chronic kidney disease: Secondary | ICD-10-CM | POA: Diagnosis not present

## 2018-11-06 DIAGNOSIS — D509 Iron deficiency anemia, unspecified: Secondary | ICD-10-CM | POA: Diagnosis not present

## 2018-11-06 DIAGNOSIS — N186 End stage renal disease: Secondary | ICD-10-CM | POA: Diagnosis not present

## 2018-11-06 DIAGNOSIS — N2581 Secondary hyperparathyroidism of renal origin: Secondary | ICD-10-CM | POA: Diagnosis not present

## 2018-11-08 DIAGNOSIS — D509 Iron deficiency anemia, unspecified: Secondary | ICD-10-CM | POA: Diagnosis not present

## 2018-11-08 DIAGNOSIS — N186 End stage renal disease: Secondary | ICD-10-CM | POA: Diagnosis not present

## 2018-11-08 DIAGNOSIS — N2581 Secondary hyperparathyroidism of renal origin: Secondary | ICD-10-CM | POA: Diagnosis not present

## 2018-11-08 DIAGNOSIS — D631 Anemia in chronic kidney disease: Secondary | ICD-10-CM | POA: Diagnosis not present

## 2018-11-10 DIAGNOSIS — N186 End stage renal disease: Secondary | ICD-10-CM | POA: Diagnosis not present

## 2018-11-10 DIAGNOSIS — D631 Anemia in chronic kidney disease: Secondary | ICD-10-CM | POA: Diagnosis not present

## 2018-11-10 DIAGNOSIS — N2581 Secondary hyperparathyroidism of renal origin: Secondary | ICD-10-CM | POA: Diagnosis not present

## 2018-11-10 DIAGNOSIS — D509 Iron deficiency anemia, unspecified: Secondary | ICD-10-CM | POA: Diagnosis not present

## 2018-11-13 DIAGNOSIS — N2581 Secondary hyperparathyroidism of renal origin: Secondary | ICD-10-CM | POA: Diagnosis not present

## 2018-11-13 DIAGNOSIS — D509 Iron deficiency anemia, unspecified: Secondary | ICD-10-CM | POA: Diagnosis not present

## 2018-11-13 DIAGNOSIS — D631 Anemia in chronic kidney disease: Secondary | ICD-10-CM | POA: Diagnosis not present

## 2018-11-13 DIAGNOSIS — N186 End stage renal disease: Secondary | ICD-10-CM | POA: Diagnosis not present

## 2018-11-15 DIAGNOSIS — D631 Anemia in chronic kidney disease: Secondary | ICD-10-CM | POA: Diagnosis not present

## 2018-11-15 DIAGNOSIS — D509 Iron deficiency anemia, unspecified: Secondary | ICD-10-CM | POA: Diagnosis not present

## 2018-11-15 DIAGNOSIS — N2581 Secondary hyperparathyroidism of renal origin: Secondary | ICD-10-CM | POA: Diagnosis not present

## 2018-11-15 DIAGNOSIS — N186 End stage renal disease: Secondary | ICD-10-CM | POA: Diagnosis not present

## 2018-11-17 DIAGNOSIS — D509 Iron deficiency anemia, unspecified: Secondary | ICD-10-CM | POA: Diagnosis not present

## 2018-11-17 DIAGNOSIS — N2581 Secondary hyperparathyroidism of renal origin: Secondary | ICD-10-CM | POA: Diagnosis not present

## 2018-11-17 DIAGNOSIS — D631 Anemia in chronic kidney disease: Secondary | ICD-10-CM | POA: Diagnosis not present

## 2018-11-17 DIAGNOSIS — N186 End stage renal disease: Secondary | ICD-10-CM | POA: Diagnosis not present

## 2018-11-20 DIAGNOSIS — D631 Anemia in chronic kidney disease: Secondary | ICD-10-CM | POA: Diagnosis not present

## 2018-11-20 DIAGNOSIS — N186 End stage renal disease: Secondary | ICD-10-CM | POA: Diagnosis not present

## 2018-11-20 DIAGNOSIS — D509 Iron deficiency anemia, unspecified: Secondary | ICD-10-CM | POA: Diagnosis not present

## 2018-11-20 DIAGNOSIS — N2581 Secondary hyperparathyroidism of renal origin: Secondary | ICD-10-CM | POA: Diagnosis not present

## 2018-11-22 DIAGNOSIS — D509 Iron deficiency anemia, unspecified: Secondary | ICD-10-CM | POA: Diagnosis not present

## 2018-11-22 DIAGNOSIS — N2581 Secondary hyperparathyroidism of renal origin: Secondary | ICD-10-CM | POA: Diagnosis not present

## 2018-11-22 DIAGNOSIS — D631 Anemia in chronic kidney disease: Secondary | ICD-10-CM | POA: Diagnosis not present

## 2018-11-22 DIAGNOSIS — Z992 Dependence on renal dialysis: Secondary | ICD-10-CM | POA: Diagnosis not present

## 2018-11-22 DIAGNOSIS — N186 End stage renal disease: Secondary | ICD-10-CM | POA: Diagnosis not present

## 2018-11-24 DIAGNOSIS — N2581 Secondary hyperparathyroidism of renal origin: Secondary | ICD-10-CM | POA: Diagnosis not present

## 2018-11-24 DIAGNOSIS — D509 Iron deficiency anemia, unspecified: Secondary | ICD-10-CM | POA: Diagnosis not present

## 2018-11-24 DIAGNOSIS — E8779 Other fluid overload: Secondary | ICD-10-CM | POA: Diagnosis not present

## 2018-11-24 DIAGNOSIS — N186 End stage renal disease: Secondary | ICD-10-CM | POA: Diagnosis not present

## 2018-11-24 DIAGNOSIS — D631 Anemia in chronic kidney disease: Secondary | ICD-10-CM | POA: Diagnosis not present

## 2018-11-24 DIAGNOSIS — Z23 Encounter for immunization: Secondary | ICD-10-CM | POA: Diagnosis not present

## 2018-11-27 DIAGNOSIS — Z23 Encounter for immunization: Secondary | ICD-10-CM | POA: Diagnosis not present

## 2018-11-27 DIAGNOSIS — N186 End stage renal disease: Secondary | ICD-10-CM | POA: Diagnosis not present

## 2018-11-27 DIAGNOSIS — E8779 Other fluid overload: Secondary | ICD-10-CM | POA: Diagnosis not present

## 2018-11-27 DIAGNOSIS — D509 Iron deficiency anemia, unspecified: Secondary | ICD-10-CM | POA: Diagnosis not present

## 2018-11-27 DIAGNOSIS — D631 Anemia in chronic kidney disease: Secondary | ICD-10-CM | POA: Diagnosis not present

## 2018-11-27 DIAGNOSIS — N2581 Secondary hyperparathyroidism of renal origin: Secondary | ICD-10-CM | POA: Diagnosis not present

## 2018-11-29 DIAGNOSIS — Z992 Dependence on renal dialysis: Secondary | ICD-10-CM | POA: Diagnosis not present

## 2018-11-29 DIAGNOSIS — Z9114 Patient's other noncompliance with medication regimen: Secondary | ICD-10-CM | POA: Diagnosis not present

## 2018-11-29 DIAGNOSIS — D509 Iron deficiency anemia, unspecified: Secondary | ICD-10-CM | POA: Diagnosis not present

## 2018-11-29 DIAGNOSIS — R519 Headache, unspecified: Secondary | ICD-10-CM | POA: Diagnosis not present

## 2018-11-29 DIAGNOSIS — I5022 Chronic systolic (congestive) heart failure: Secondary | ICD-10-CM | POA: Diagnosis not present

## 2018-11-29 DIAGNOSIS — N2581 Secondary hyperparathyroidism of renal origin: Secondary | ICD-10-CM | POA: Diagnosis not present

## 2018-11-29 DIAGNOSIS — I482 Chronic atrial fibrillation, unspecified: Secondary | ICD-10-CM | POA: Diagnosis not present

## 2018-11-29 DIAGNOSIS — R0789 Other chest pain: Secondary | ICD-10-CM | POA: Diagnosis not present

## 2018-11-29 DIAGNOSIS — R002 Palpitations: Secondary | ICD-10-CM | POA: Diagnosis not present

## 2018-11-29 DIAGNOSIS — R0602 Shortness of breath: Secondary | ICD-10-CM | POA: Diagnosis not present

## 2018-11-29 DIAGNOSIS — I4891 Unspecified atrial fibrillation: Secondary | ICD-10-CM | POA: Diagnosis not present

## 2018-11-29 DIAGNOSIS — I491 Atrial premature depolarization: Secondary | ICD-10-CM | POA: Diagnosis not present

## 2018-11-29 DIAGNOSIS — N186 End stage renal disease: Secondary | ICD-10-CM | POA: Diagnosis not present

## 2018-11-29 DIAGNOSIS — N185 Chronic kidney disease, stage 5: Secondary | ICD-10-CM | POA: Diagnosis not present

## 2018-11-29 DIAGNOSIS — R000 Tachycardia, unspecified: Secondary | ICD-10-CM | POA: Diagnosis not present

## 2018-11-29 DIAGNOSIS — Z23 Encounter for immunization: Secondary | ICD-10-CM | POA: Diagnosis not present

## 2018-11-29 DIAGNOSIS — I132 Hypertensive heart and chronic kidney disease with heart failure and with stage 5 chronic kidney disease, or end stage renal disease: Secondary | ICD-10-CM | POA: Diagnosis not present

## 2018-11-29 DIAGNOSIS — D631 Anemia in chronic kidney disease: Secondary | ICD-10-CM | POA: Diagnosis not present

## 2018-11-29 DIAGNOSIS — E8779 Other fluid overload: Secondary | ICD-10-CM | POA: Diagnosis not present

## 2018-11-29 DIAGNOSIS — R079 Chest pain, unspecified: Secondary | ICD-10-CM | POA: Diagnosis not present

## 2018-12-01 DIAGNOSIS — E8779 Other fluid overload: Secondary | ICD-10-CM | POA: Diagnosis not present

## 2018-12-01 DIAGNOSIS — N2581 Secondary hyperparathyroidism of renal origin: Secondary | ICD-10-CM | POA: Diagnosis not present

## 2018-12-01 DIAGNOSIS — D631 Anemia in chronic kidney disease: Secondary | ICD-10-CM | POA: Diagnosis not present

## 2018-12-01 DIAGNOSIS — Z23 Encounter for immunization: Secondary | ICD-10-CM | POA: Diagnosis not present

## 2018-12-01 DIAGNOSIS — D509 Iron deficiency anemia, unspecified: Secondary | ICD-10-CM | POA: Diagnosis not present

## 2018-12-01 DIAGNOSIS — N186 End stage renal disease: Secondary | ICD-10-CM | POA: Diagnosis not present

## 2018-12-04 DIAGNOSIS — N2581 Secondary hyperparathyroidism of renal origin: Secondary | ICD-10-CM | POA: Diagnosis not present

## 2018-12-04 DIAGNOSIS — Z23 Encounter for immunization: Secondary | ICD-10-CM | POA: Diagnosis not present

## 2018-12-04 DIAGNOSIS — D509 Iron deficiency anemia, unspecified: Secondary | ICD-10-CM | POA: Diagnosis not present

## 2018-12-04 DIAGNOSIS — N186 End stage renal disease: Secondary | ICD-10-CM | POA: Diagnosis not present

## 2018-12-04 DIAGNOSIS — D631 Anemia in chronic kidney disease: Secondary | ICD-10-CM | POA: Diagnosis not present

## 2018-12-04 DIAGNOSIS — E8779 Other fluid overload: Secondary | ICD-10-CM | POA: Diagnosis not present

## 2018-12-06 DIAGNOSIS — N2581 Secondary hyperparathyroidism of renal origin: Secondary | ICD-10-CM | POA: Diagnosis not present

## 2018-12-06 DIAGNOSIS — D509 Iron deficiency anemia, unspecified: Secondary | ICD-10-CM | POA: Diagnosis not present

## 2018-12-06 DIAGNOSIS — Z23 Encounter for immunization: Secondary | ICD-10-CM | POA: Diagnosis not present

## 2018-12-06 DIAGNOSIS — N186 End stage renal disease: Secondary | ICD-10-CM | POA: Diagnosis not present

## 2018-12-06 DIAGNOSIS — D631 Anemia in chronic kidney disease: Secondary | ICD-10-CM | POA: Diagnosis not present

## 2018-12-06 DIAGNOSIS — E8779 Other fluid overload: Secondary | ICD-10-CM | POA: Diagnosis not present

## 2018-12-08 DIAGNOSIS — D631 Anemia in chronic kidney disease: Secondary | ICD-10-CM | POA: Diagnosis not present

## 2018-12-08 DIAGNOSIS — Z23 Encounter for immunization: Secondary | ICD-10-CM | POA: Diagnosis not present

## 2018-12-08 DIAGNOSIS — D509 Iron deficiency anemia, unspecified: Secondary | ICD-10-CM | POA: Diagnosis not present

## 2018-12-08 DIAGNOSIS — N186 End stage renal disease: Secondary | ICD-10-CM | POA: Diagnosis not present

## 2018-12-08 DIAGNOSIS — N2581 Secondary hyperparathyroidism of renal origin: Secondary | ICD-10-CM | POA: Diagnosis not present

## 2018-12-08 DIAGNOSIS — E8779 Other fluid overload: Secondary | ICD-10-CM | POA: Diagnosis not present

## 2018-12-11 DIAGNOSIS — N2581 Secondary hyperparathyroidism of renal origin: Secondary | ICD-10-CM | POA: Diagnosis not present

## 2018-12-11 DIAGNOSIS — D509 Iron deficiency anemia, unspecified: Secondary | ICD-10-CM | POA: Diagnosis not present

## 2018-12-11 DIAGNOSIS — N186 End stage renal disease: Secondary | ICD-10-CM | POA: Diagnosis not present

## 2018-12-11 DIAGNOSIS — Z23 Encounter for immunization: Secondary | ICD-10-CM | POA: Diagnosis not present

## 2018-12-11 DIAGNOSIS — D631 Anemia in chronic kidney disease: Secondary | ICD-10-CM | POA: Diagnosis not present

## 2018-12-11 DIAGNOSIS — E8779 Other fluid overload: Secondary | ICD-10-CM | POA: Diagnosis not present

## 2018-12-15 DIAGNOSIS — D509 Iron deficiency anemia, unspecified: Secondary | ICD-10-CM | POA: Diagnosis not present

## 2018-12-15 DIAGNOSIS — D631 Anemia in chronic kidney disease: Secondary | ICD-10-CM | POA: Diagnosis not present

## 2018-12-15 DIAGNOSIS — E8779 Other fluid overload: Secondary | ICD-10-CM | POA: Diagnosis not present

## 2018-12-15 DIAGNOSIS — N186 End stage renal disease: Secondary | ICD-10-CM | POA: Diagnosis not present

## 2018-12-15 DIAGNOSIS — N2581 Secondary hyperparathyroidism of renal origin: Secondary | ICD-10-CM | POA: Diagnosis not present

## 2018-12-15 DIAGNOSIS — Z23 Encounter for immunization: Secondary | ICD-10-CM | POA: Diagnosis not present

## 2018-12-16 DIAGNOSIS — Z23 Encounter for immunization: Secondary | ICD-10-CM | POA: Diagnosis not present

## 2018-12-16 DIAGNOSIS — D631 Anemia in chronic kidney disease: Secondary | ICD-10-CM | POA: Diagnosis not present

## 2018-12-16 DIAGNOSIS — D509 Iron deficiency anemia, unspecified: Secondary | ICD-10-CM | POA: Diagnosis not present

## 2018-12-16 DIAGNOSIS — N186 End stage renal disease: Secondary | ICD-10-CM | POA: Diagnosis not present

## 2018-12-16 DIAGNOSIS — E8779 Other fluid overload: Secondary | ICD-10-CM | POA: Diagnosis not present

## 2018-12-16 DIAGNOSIS — N2581 Secondary hyperparathyroidism of renal origin: Secondary | ICD-10-CM | POA: Diagnosis not present

## 2018-12-18 DIAGNOSIS — E8779 Other fluid overload: Secondary | ICD-10-CM | POA: Diagnosis not present

## 2018-12-18 DIAGNOSIS — N186 End stage renal disease: Secondary | ICD-10-CM | POA: Diagnosis not present

## 2018-12-18 DIAGNOSIS — D631 Anemia in chronic kidney disease: Secondary | ICD-10-CM | POA: Diagnosis not present

## 2018-12-18 DIAGNOSIS — N2581 Secondary hyperparathyroidism of renal origin: Secondary | ICD-10-CM | POA: Diagnosis not present

## 2018-12-18 DIAGNOSIS — D509 Iron deficiency anemia, unspecified: Secondary | ICD-10-CM | POA: Diagnosis not present

## 2018-12-18 DIAGNOSIS — Z23 Encounter for immunization: Secondary | ICD-10-CM | POA: Diagnosis not present

## 2018-12-19 DIAGNOSIS — E8779 Other fluid overload: Secondary | ICD-10-CM | POA: Diagnosis not present

## 2018-12-19 DIAGNOSIS — D509 Iron deficiency anemia, unspecified: Secondary | ICD-10-CM | POA: Diagnosis not present

## 2018-12-19 DIAGNOSIS — D631 Anemia in chronic kidney disease: Secondary | ICD-10-CM | POA: Diagnosis not present

## 2018-12-19 DIAGNOSIS — N186 End stage renal disease: Secondary | ICD-10-CM | POA: Diagnosis not present

## 2018-12-19 DIAGNOSIS — N2581 Secondary hyperparathyroidism of renal origin: Secondary | ICD-10-CM | POA: Diagnosis not present

## 2018-12-19 DIAGNOSIS — Z23 Encounter for immunization: Secondary | ICD-10-CM | POA: Diagnosis not present

## 2018-12-20 DIAGNOSIS — N2581 Secondary hyperparathyroidism of renal origin: Secondary | ICD-10-CM | POA: Diagnosis not present

## 2018-12-20 DIAGNOSIS — N186 End stage renal disease: Secondary | ICD-10-CM | POA: Diagnosis not present

## 2018-12-20 DIAGNOSIS — D509 Iron deficiency anemia, unspecified: Secondary | ICD-10-CM | POA: Diagnosis not present

## 2018-12-20 DIAGNOSIS — D631 Anemia in chronic kidney disease: Secondary | ICD-10-CM | POA: Diagnosis not present

## 2018-12-20 DIAGNOSIS — Z23 Encounter for immunization: Secondary | ICD-10-CM | POA: Diagnosis not present

## 2018-12-20 DIAGNOSIS — E8779 Other fluid overload: Secondary | ICD-10-CM | POA: Diagnosis not present

## 2018-12-22 DIAGNOSIS — N2581 Secondary hyperparathyroidism of renal origin: Secondary | ICD-10-CM | POA: Diagnosis not present

## 2018-12-22 DIAGNOSIS — Z23 Encounter for immunization: Secondary | ICD-10-CM | POA: Diagnosis not present

## 2018-12-22 DIAGNOSIS — N186 End stage renal disease: Secondary | ICD-10-CM | POA: Diagnosis not present

## 2018-12-22 DIAGNOSIS — D509 Iron deficiency anemia, unspecified: Secondary | ICD-10-CM | POA: Diagnosis not present

## 2018-12-22 DIAGNOSIS — E8779 Other fluid overload: Secondary | ICD-10-CM | POA: Diagnosis not present

## 2018-12-22 DIAGNOSIS — D631 Anemia in chronic kidney disease: Secondary | ICD-10-CM | POA: Diagnosis not present

## 2018-12-23 DIAGNOSIS — N186 End stage renal disease: Secondary | ICD-10-CM | POA: Diagnosis not present

## 2018-12-23 DIAGNOSIS — Z992 Dependence on renal dialysis: Secondary | ICD-10-CM | POA: Diagnosis not present

## 2018-12-25 DIAGNOSIS — N186 End stage renal disease: Secondary | ICD-10-CM | POA: Diagnosis not present

## 2018-12-25 DIAGNOSIS — N2581 Secondary hyperparathyroidism of renal origin: Secondary | ICD-10-CM | POA: Diagnosis not present

## 2018-12-25 DIAGNOSIS — D631 Anemia in chronic kidney disease: Secondary | ICD-10-CM | POA: Diagnosis not present

## 2018-12-27 DIAGNOSIS — D631 Anemia in chronic kidney disease: Secondary | ICD-10-CM | POA: Diagnosis not present

## 2018-12-27 DIAGNOSIS — N2581 Secondary hyperparathyroidism of renal origin: Secondary | ICD-10-CM | POA: Diagnosis not present

## 2018-12-27 DIAGNOSIS — N186 End stage renal disease: Secondary | ICD-10-CM | POA: Diagnosis not present

## 2018-12-29 DIAGNOSIS — D631 Anemia in chronic kidney disease: Secondary | ICD-10-CM | POA: Diagnosis not present

## 2018-12-29 DIAGNOSIS — N186 End stage renal disease: Secondary | ICD-10-CM | POA: Diagnosis not present

## 2018-12-29 DIAGNOSIS — N2581 Secondary hyperparathyroidism of renal origin: Secondary | ICD-10-CM | POA: Diagnosis not present

## 2019-01-01 DIAGNOSIS — N2581 Secondary hyperparathyroidism of renal origin: Secondary | ICD-10-CM | POA: Diagnosis not present

## 2019-01-01 DIAGNOSIS — N186 End stage renal disease: Secondary | ICD-10-CM | POA: Diagnosis not present

## 2019-01-01 DIAGNOSIS — D631 Anemia in chronic kidney disease: Secondary | ICD-10-CM | POA: Diagnosis not present

## 2019-01-03 DIAGNOSIS — D631 Anemia in chronic kidney disease: Secondary | ICD-10-CM | POA: Diagnosis not present

## 2019-01-03 DIAGNOSIS — N186 End stage renal disease: Secondary | ICD-10-CM | POA: Diagnosis not present

## 2019-01-03 DIAGNOSIS — N2581 Secondary hyperparathyroidism of renal origin: Secondary | ICD-10-CM | POA: Diagnosis not present

## 2019-01-05 DIAGNOSIS — N2581 Secondary hyperparathyroidism of renal origin: Secondary | ICD-10-CM | POA: Diagnosis not present

## 2019-01-05 DIAGNOSIS — N186 End stage renal disease: Secondary | ICD-10-CM | POA: Diagnosis not present

## 2019-01-05 DIAGNOSIS — D631 Anemia in chronic kidney disease: Secondary | ICD-10-CM | POA: Diagnosis not present

## 2019-01-08 DIAGNOSIS — N2581 Secondary hyperparathyroidism of renal origin: Secondary | ICD-10-CM | POA: Diagnosis not present

## 2019-01-08 DIAGNOSIS — N186 End stage renal disease: Secondary | ICD-10-CM | POA: Diagnosis not present

## 2019-01-08 DIAGNOSIS — D631 Anemia in chronic kidney disease: Secondary | ICD-10-CM | POA: Diagnosis not present

## 2019-01-12 DIAGNOSIS — D631 Anemia in chronic kidney disease: Secondary | ICD-10-CM | POA: Diagnosis not present

## 2019-01-12 DIAGNOSIS — N186 End stage renal disease: Secondary | ICD-10-CM | POA: Diagnosis not present

## 2019-01-12 DIAGNOSIS — N2581 Secondary hyperparathyroidism of renal origin: Secondary | ICD-10-CM | POA: Diagnosis not present

## 2019-01-14 DIAGNOSIS — D631 Anemia in chronic kidney disease: Secondary | ICD-10-CM | POA: Diagnosis not present

## 2019-01-14 DIAGNOSIS — N186 End stage renal disease: Secondary | ICD-10-CM | POA: Diagnosis not present

## 2019-01-14 DIAGNOSIS — N2581 Secondary hyperparathyroidism of renal origin: Secondary | ICD-10-CM | POA: Diagnosis not present

## 2019-01-16 DIAGNOSIS — N186 End stage renal disease: Secondary | ICD-10-CM | POA: Diagnosis not present

## 2019-01-16 DIAGNOSIS — D631 Anemia in chronic kidney disease: Secondary | ICD-10-CM | POA: Diagnosis not present

## 2019-01-16 DIAGNOSIS — N2581 Secondary hyperparathyroidism of renal origin: Secondary | ICD-10-CM | POA: Diagnosis not present

## 2019-01-19 DIAGNOSIS — D631 Anemia in chronic kidney disease: Secondary | ICD-10-CM | POA: Diagnosis not present

## 2019-01-19 DIAGNOSIS — N186 End stage renal disease: Secondary | ICD-10-CM | POA: Diagnosis not present

## 2019-01-19 DIAGNOSIS — N2581 Secondary hyperparathyroidism of renal origin: Secondary | ICD-10-CM | POA: Diagnosis not present

## 2019-01-22 DIAGNOSIS — N186 End stage renal disease: Secondary | ICD-10-CM | POA: Diagnosis not present

## 2019-01-22 DIAGNOSIS — Z992 Dependence on renal dialysis: Secondary | ICD-10-CM | POA: Diagnosis not present

## 2019-01-22 DIAGNOSIS — N2581 Secondary hyperparathyroidism of renal origin: Secondary | ICD-10-CM | POA: Diagnosis not present

## 2019-01-22 DIAGNOSIS — D631 Anemia in chronic kidney disease: Secondary | ICD-10-CM | POA: Diagnosis not present

## 2019-02-25 DIAGNOSIS — D631 Anemia in chronic kidney disease: Secondary | ICD-10-CM | POA: Diagnosis not present

## 2019-02-25 DIAGNOSIS — N2581 Secondary hyperparathyroidism of renal origin: Secondary | ICD-10-CM | POA: Diagnosis not present

## 2019-02-25 DIAGNOSIS — E8779 Other fluid overload: Secondary | ICD-10-CM | POA: Diagnosis not present

## 2019-02-25 DIAGNOSIS — N186 End stage renal disease: Secondary | ICD-10-CM | POA: Diagnosis not present

## 2019-02-26 DIAGNOSIS — D631 Anemia in chronic kidney disease: Secondary | ICD-10-CM | POA: Diagnosis not present

## 2019-02-26 DIAGNOSIS — N2581 Secondary hyperparathyroidism of renal origin: Secondary | ICD-10-CM | POA: Diagnosis not present

## 2019-02-26 DIAGNOSIS — N186 End stage renal disease: Secondary | ICD-10-CM | POA: Diagnosis not present

## 2019-02-26 DIAGNOSIS — E8779 Other fluid overload: Secondary | ICD-10-CM | POA: Diagnosis not present

## 2019-02-28 DIAGNOSIS — E8779 Other fluid overload: Secondary | ICD-10-CM | POA: Diagnosis not present

## 2019-02-28 DIAGNOSIS — D631 Anemia in chronic kidney disease: Secondary | ICD-10-CM | POA: Diagnosis not present

## 2019-02-28 DIAGNOSIS — N186 End stage renal disease: Secondary | ICD-10-CM | POA: Diagnosis not present

## 2019-02-28 DIAGNOSIS — N2581 Secondary hyperparathyroidism of renal origin: Secondary | ICD-10-CM | POA: Diagnosis not present

## 2019-03-05 DIAGNOSIS — N186 End stage renal disease: Secondary | ICD-10-CM | POA: Diagnosis not present

## 2019-03-05 DIAGNOSIS — N2581 Secondary hyperparathyroidism of renal origin: Secondary | ICD-10-CM | POA: Diagnosis not present

## 2019-03-05 DIAGNOSIS — E8779 Other fluid overload: Secondary | ICD-10-CM | POA: Diagnosis not present

## 2019-03-05 DIAGNOSIS — D631 Anemia in chronic kidney disease: Secondary | ICD-10-CM | POA: Diagnosis not present

## 2019-03-06 DIAGNOSIS — N2581 Secondary hyperparathyroidism of renal origin: Secondary | ICD-10-CM | POA: Diagnosis not present

## 2019-03-06 DIAGNOSIS — E8779 Other fluid overload: Secondary | ICD-10-CM | POA: Diagnosis not present

## 2019-03-06 DIAGNOSIS — N186 End stage renal disease: Secondary | ICD-10-CM | POA: Diagnosis not present

## 2019-03-06 DIAGNOSIS — D631 Anemia in chronic kidney disease: Secondary | ICD-10-CM | POA: Diagnosis not present

## 2019-03-07 DIAGNOSIS — E8779 Other fluid overload: Secondary | ICD-10-CM | POA: Diagnosis not present

## 2019-03-07 DIAGNOSIS — N186 End stage renal disease: Secondary | ICD-10-CM | POA: Diagnosis not present

## 2019-03-07 DIAGNOSIS — N2581 Secondary hyperparathyroidism of renal origin: Secondary | ICD-10-CM | POA: Diagnosis not present

## 2019-03-07 DIAGNOSIS — D631 Anemia in chronic kidney disease: Secondary | ICD-10-CM | POA: Diagnosis not present

## 2019-03-09 DIAGNOSIS — D631 Anemia in chronic kidney disease: Secondary | ICD-10-CM | POA: Diagnosis not present

## 2019-03-09 DIAGNOSIS — N2581 Secondary hyperparathyroidism of renal origin: Secondary | ICD-10-CM | POA: Diagnosis not present

## 2019-03-09 DIAGNOSIS — N186 End stage renal disease: Secondary | ICD-10-CM | POA: Diagnosis not present

## 2019-03-09 DIAGNOSIS — E8779 Other fluid overload: Secondary | ICD-10-CM | POA: Diagnosis not present

## 2019-03-12 DIAGNOSIS — N2581 Secondary hyperparathyroidism of renal origin: Secondary | ICD-10-CM | POA: Diagnosis not present

## 2019-03-12 DIAGNOSIS — E8779 Other fluid overload: Secondary | ICD-10-CM | POA: Diagnosis not present

## 2019-03-12 DIAGNOSIS — N186 End stage renal disease: Secondary | ICD-10-CM | POA: Diagnosis not present

## 2019-03-12 DIAGNOSIS — D631 Anemia in chronic kidney disease: Secondary | ICD-10-CM | POA: Diagnosis not present

## 2019-03-14 DIAGNOSIS — N186 End stage renal disease: Secondary | ICD-10-CM | POA: Diagnosis not present

## 2019-03-14 DIAGNOSIS — D631 Anemia in chronic kidney disease: Secondary | ICD-10-CM | POA: Diagnosis not present

## 2019-03-14 DIAGNOSIS — E8779 Other fluid overload: Secondary | ICD-10-CM | POA: Diagnosis not present

## 2019-03-14 DIAGNOSIS — N2581 Secondary hyperparathyroidism of renal origin: Secondary | ICD-10-CM | POA: Diagnosis not present

## 2019-03-16 DIAGNOSIS — D631 Anemia in chronic kidney disease: Secondary | ICD-10-CM | POA: Diagnosis not present

## 2019-03-16 DIAGNOSIS — N186 End stage renal disease: Secondary | ICD-10-CM | POA: Diagnosis not present

## 2019-03-16 DIAGNOSIS — N2581 Secondary hyperparathyroidism of renal origin: Secondary | ICD-10-CM | POA: Diagnosis not present

## 2019-03-16 DIAGNOSIS — E8779 Other fluid overload: Secondary | ICD-10-CM | POA: Diagnosis not present

## 2019-03-17 IMAGING — DX DG CHEST 1V PORT
1 series · 1 of 1 positions shown · non-contrast
Comparison: Chest x-ray of June 21, 2015

CLINICAL DATA: Shortness of breath and weakness

EXAM:
PORTABLE CHEST 1 VIEW

[chest ap]
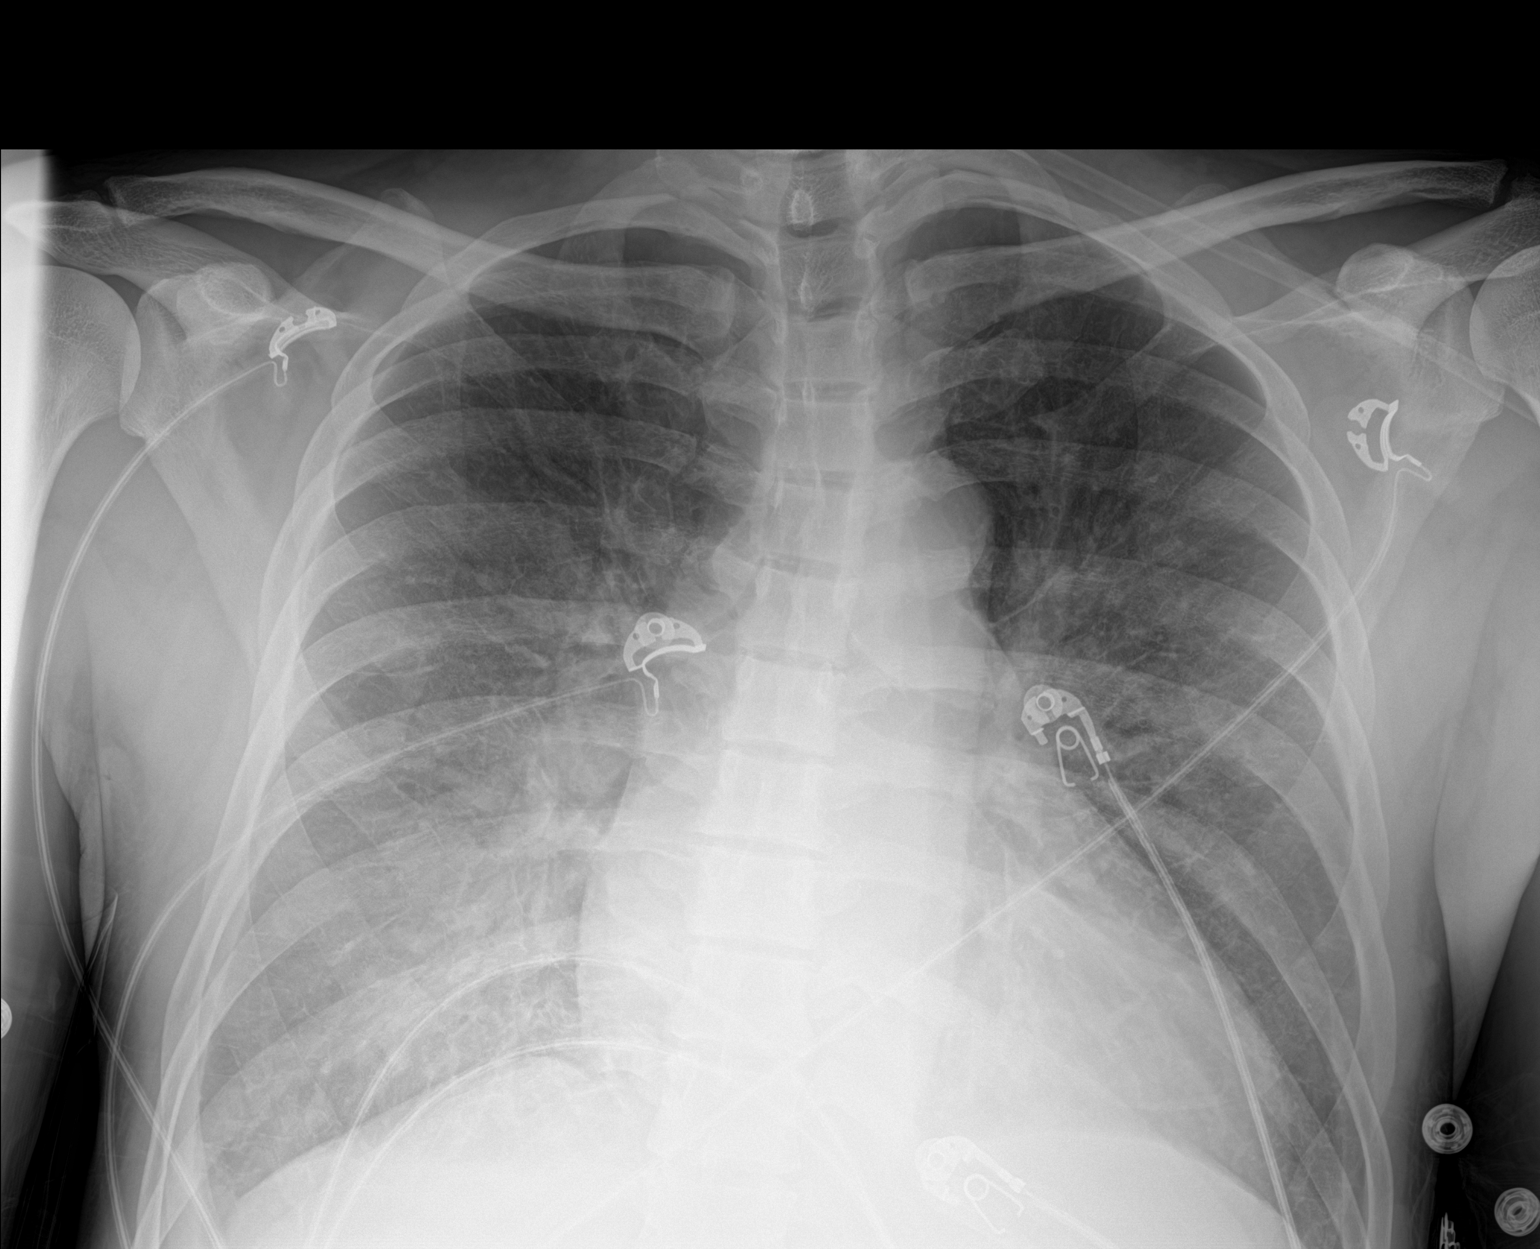

[1 of 1 positions shown; findings below may reference images not displayed]

FINDINGS: The lungs are adequately inflated. The interstitial markings are
increased and there is near confluence of these markings in the
perihilar and infrahilar regions. There is no pleural effusion. The
cardiac silhouette is enlarged and the pulmonary vascularity
engorged. There is mild curvature of the lower thoracic spine convex
toward the right.
IMPRESSION: CHF with pulmonary interstitial and alveolar edema. The appearance
of the chest has not significantly improved since the earlier study.

## 2019-03-19 DIAGNOSIS — N186 End stage renal disease: Secondary | ICD-10-CM | POA: Diagnosis not present

## 2019-03-19 DIAGNOSIS — N2581 Secondary hyperparathyroidism of renal origin: Secondary | ICD-10-CM | POA: Diagnosis not present

## 2019-03-19 DIAGNOSIS — D631 Anemia in chronic kidney disease: Secondary | ICD-10-CM | POA: Diagnosis not present

## 2019-03-19 DIAGNOSIS — E8779 Other fluid overload: Secondary | ICD-10-CM | POA: Diagnosis not present

## 2019-03-21 DIAGNOSIS — N2581 Secondary hyperparathyroidism of renal origin: Secondary | ICD-10-CM | POA: Diagnosis not present

## 2019-03-21 DIAGNOSIS — N186 End stage renal disease: Secondary | ICD-10-CM | POA: Diagnosis not present

## 2019-03-21 DIAGNOSIS — E8779 Other fluid overload: Secondary | ICD-10-CM | POA: Diagnosis not present

## 2019-03-21 DIAGNOSIS — D631 Anemia in chronic kidney disease: Secondary | ICD-10-CM | POA: Diagnosis not present

## 2019-03-23 DIAGNOSIS — D631 Anemia in chronic kidney disease: Secondary | ICD-10-CM | POA: Diagnosis not present

## 2019-03-23 DIAGNOSIS — N2581 Secondary hyperparathyroidism of renal origin: Secondary | ICD-10-CM | POA: Diagnosis not present

## 2019-03-23 DIAGNOSIS — E8779 Other fluid overload: Secondary | ICD-10-CM | POA: Diagnosis not present

## 2019-03-23 DIAGNOSIS — N186 End stage renal disease: Secondary | ICD-10-CM | POA: Diagnosis not present

## 2019-03-25 DIAGNOSIS — N186 End stage renal disease: Secondary | ICD-10-CM | POA: Diagnosis not present

## 2019-03-25 DIAGNOSIS — Z992 Dependence on renal dialysis: Secondary | ICD-10-CM | POA: Diagnosis not present

## 2019-03-26 DIAGNOSIS — D509 Iron deficiency anemia, unspecified: Secondary | ICD-10-CM | POA: Diagnosis not present

## 2019-03-26 DIAGNOSIS — N186 End stage renal disease: Secondary | ICD-10-CM | POA: Diagnosis not present

## 2019-03-26 DIAGNOSIS — D631 Anemia in chronic kidney disease: Secondary | ICD-10-CM | POA: Diagnosis not present

## 2019-03-26 DIAGNOSIS — N2581 Secondary hyperparathyroidism of renal origin: Secondary | ICD-10-CM | POA: Diagnosis not present

## 2019-03-28 DIAGNOSIS — N2581 Secondary hyperparathyroidism of renal origin: Secondary | ICD-10-CM | POA: Diagnosis not present

## 2019-03-28 DIAGNOSIS — D631 Anemia in chronic kidney disease: Secondary | ICD-10-CM | POA: Diagnosis not present

## 2019-03-28 DIAGNOSIS — N186 End stage renal disease: Secondary | ICD-10-CM | POA: Diagnosis not present

## 2019-03-28 DIAGNOSIS — D509 Iron deficiency anemia, unspecified: Secondary | ICD-10-CM | POA: Diagnosis not present

## 2019-03-30 DIAGNOSIS — D509 Iron deficiency anemia, unspecified: Secondary | ICD-10-CM | POA: Diagnosis not present

## 2019-03-30 DIAGNOSIS — N2581 Secondary hyperparathyroidism of renal origin: Secondary | ICD-10-CM | POA: Diagnosis not present

## 2019-03-30 DIAGNOSIS — D631 Anemia in chronic kidney disease: Secondary | ICD-10-CM | POA: Diagnosis not present

## 2019-03-30 DIAGNOSIS — N186 End stage renal disease: Secondary | ICD-10-CM | POA: Diagnosis not present

## 2019-04-02 DIAGNOSIS — N186 End stage renal disease: Secondary | ICD-10-CM | POA: Diagnosis not present

## 2019-04-02 DIAGNOSIS — N2581 Secondary hyperparathyroidism of renal origin: Secondary | ICD-10-CM | POA: Diagnosis not present

## 2019-04-02 DIAGNOSIS — D509 Iron deficiency anemia, unspecified: Secondary | ICD-10-CM | POA: Diagnosis not present

## 2019-04-02 DIAGNOSIS — D631 Anemia in chronic kidney disease: Secondary | ICD-10-CM | POA: Diagnosis not present

## 2019-04-04 DIAGNOSIS — D631 Anemia in chronic kidney disease: Secondary | ICD-10-CM | POA: Diagnosis not present

## 2019-04-04 DIAGNOSIS — D509 Iron deficiency anemia, unspecified: Secondary | ICD-10-CM | POA: Diagnosis not present

## 2019-04-04 DIAGNOSIS — N2581 Secondary hyperparathyroidism of renal origin: Secondary | ICD-10-CM | POA: Diagnosis not present

## 2019-04-04 DIAGNOSIS — N186 End stage renal disease: Secondary | ICD-10-CM | POA: Diagnosis not present

## 2019-04-06 DIAGNOSIS — N186 End stage renal disease: Secondary | ICD-10-CM | POA: Diagnosis not present

## 2019-04-06 DIAGNOSIS — N2581 Secondary hyperparathyroidism of renal origin: Secondary | ICD-10-CM | POA: Diagnosis not present

## 2019-04-06 DIAGNOSIS — D631 Anemia in chronic kidney disease: Secondary | ICD-10-CM | POA: Diagnosis not present

## 2019-04-06 DIAGNOSIS — D509 Iron deficiency anemia, unspecified: Secondary | ICD-10-CM | POA: Diagnosis not present

## 2019-04-09 DIAGNOSIS — N186 End stage renal disease: Secondary | ICD-10-CM | POA: Diagnosis not present

## 2019-04-09 DIAGNOSIS — D631 Anemia in chronic kidney disease: Secondary | ICD-10-CM | POA: Diagnosis not present

## 2019-04-09 DIAGNOSIS — N2581 Secondary hyperparathyroidism of renal origin: Secondary | ICD-10-CM | POA: Diagnosis not present

## 2019-04-09 DIAGNOSIS — D509 Iron deficiency anemia, unspecified: Secondary | ICD-10-CM | POA: Diagnosis not present

## 2019-04-11 DIAGNOSIS — N2581 Secondary hyperparathyroidism of renal origin: Secondary | ICD-10-CM | POA: Diagnosis not present

## 2019-04-11 DIAGNOSIS — D631 Anemia in chronic kidney disease: Secondary | ICD-10-CM | POA: Diagnosis not present

## 2019-04-11 DIAGNOSIS — D509 Iron deficiency anemia, unspecified: Secondary | ICD-10-CM | POA: Diagnosis not present

## 2019-04-11 DIAGNOSIS — N186 End stage renal disease: Secondary | ICD-10-CM | POA: Diagnosis not present

## 2019-04-13 DIAGNOSIS — N2581 Secondary hyperparathyroidism of renal origin: Secondary | ICD-10-CM | POA: Diagnosis not present

## 2019-04-13 DIAGNOSIS — N186 End stage renal disease: Secondary | ICD-10-CM | POA: Diagnosis not present

## 2019-04-13 DIAGNOSIS — D509 Iron deficiency anemia, unspecified: Secondary | ICD-10-CM | POA: Diagnosis not present

## 2019-04-13 DIAGNOSIS — D631 Anemia in chronic kidney disease: Secondary | ICD-10-CM | POA: Diagnosis not present

## 2019-04-16 DIAGNOSIS — N186 End stage renal disease: Secondary | ICD-10-CM | POA: Diagnosis not present

## 2019-04-16 DIAGNOSIS — D631 Anemia in chronic kidney disease: Secondary | ICD-10-CM | POA: Diagnosis not present

## 2019-04-16 DIAGNOSIS — N2581 Secondary hyperparathyroidism of renal origin: Secondary | ICD-10-CM | POA: Diagnosis not present

## 2019-04-16 DIAGNOSIS — D509 Iron deficiency anemia, unspecified: Secondary | ICD-10-CM | POA: Diagnosis not present

## 2019-04-18 DIAGNOSIS — D509 Iron deficiency anemia, unspecified: Secondary | ICD-10-CM | POA: Diagnosis not present

## 2019-04-18 DIAGNOSIS — D631 Anemia in chronic kidney disease: Secondary | ICD-10-CM | POA: Diagnosis not present

## 2019-04-18 DIAGNOSIS — N186 End stage renal disease: Secondary | ICD-10-CM | POA: Diagnosis not present

## 2019-04-18 DIAGNOSIS — N2581 Secondary hyperparathyroidism of renal origin: Secondary | ICD-10-CM | POA: Diagnosis not present

## 2019-04-20 DIAGNOSIS — D631 Anemia in chronic kidney disease: Secondary | ICD-10-CM | POA: Diagnosis not present

## 2019-04-20 DIAGNOSIS — N2581 Secondary hyperparathyroidism of renal origin: Secondary | ICD-10-CM | POA: Diagnosis not present

## 2019-04-20 DIAGNOSIS — D509 Iron deficiency anemia, unspecified: Secondary | ICD-10-CM | POA: Diagnosis not present

## 2019-04-20 DIAGNOSIS — N186 End stage renal disease: Secondary | ICD-10-CM | POA: Diagnosis not present

## 2019-04-22 DIAGNOSIS — Z992 Dependence on renal dialysis: Secondary | ICD-10-CM | POA: Diagnosis not present

## 2019-04-22 DIAGNOSIS — N186 End stage renal disease: Secondary | ICD-10-CM | POA: Diagnosis not present

## 2019-04-23 DIAGNOSIS — D509 Iron deficiency anemia, unspecified: Secondary | ICD-10-CM | POA: Diagnosis not present

## 2019-04-23 DIAGNOSIS — N186 End stage renal disease: Secondary | ICD-10-CM | POA: Diagnosis not present

## 2019-04-23 DIAGNOSIS — N2581 Secondary hyperparathyroidism of renal origin: Secondary | ICD-10-CM | POA: Diagnosis not present

## 2019-04-23 DIAGNOSIS — D631 Anemia in chronic kidney disease: Secondary | ICD-10-CM | POA: Diagnosis not present

## 2019-04-23 DIAGNOSIS — Z23 Encounter for immunization: Secondary | ICD-10-CM | POA: Diagnosis not present

## 2019-04-25 DIAGNOSIS — N186 End stage renal disease: Secondary | ICD-10-CM | POA: Diagnosis not present

## 2019-04-25 DIAGNOSIS — N2581 Secondary hyperparathyroidism of renal origin: Secondary | ICD-10-CM | POA: Diagnosis not present

## 2019-04-25 DIAGNOSIS — Z23 Encounter for immunization: Secondary | ICD-10-CM | POA: Diagnosis not present

## 2019-04-25 DIAGNOSIS — D509 Iron deficiency anemia, unspecified: Secondary | ICD-10-CM | POA: Diagnosis not present

## 2019-04-25 DIAGNOSIS — D631 Anemia in chronic kidney disease: Secondary | ICD-10-CM | POA: Diagnosis not present

## 2019-04-27 DIAGNOSIS — D631 Anemia in chronic kidney disease: Secondary | ICD-10-CM | POA: Diagnosis not present

## 2019-04-27 DIAGNOSIS — N186 End stage renal disease: Secondary | ICD-10-CM | POA: Diagnosis not present

## 2019-04-27 DIAGNOSIS — D509 Iron deficiency anemia, unspecified: Secondary | ICD-10-CM | POA: Diagnosis not present

## 2019-04-27 DIAGNOSIS — N2581 Secondary hyperparathyroidism of renal origin: Secondary | ICD-10-CM | POA: Diagnosis not present

## 2019-04-27 DIAGNOSIS — Z23 Encounter for immunization: Secondary | ICD-10-CM | POA: Diagnosis not present

## 2019-04-30 DIAGNOSIS — D631 Anemia in chronic kidney disease: Secondary | ICD-10-CM | POA: Diagnosis not present

## 2019-04-30 DIAGNOSIS — Z23 Encounter for immunization: Secondary | ICD-10-CM | POA: Diagnosis not present

## 2019-04-30 DIAGNOSIS — D509 Iron deficiency anemia, unspecified: Secondary | ICD-10-CM | POA: Diagnosis not present

## 2019-04-30 DIAGNOSIS — N186 End stage renal disease: Secondary | ICD-10-CM | POA: Diagnosis not present

## 2019-04-30 DIAGNOSIS — N2581 Secondary hyperparathyroidism of renal origin: Secondary | ICD-10-CM | POA: Diagnosis not present

## 2019-05-02 DIAGNOSIS — N186 End stage renal disease: Secondary | ICD-10-CM | POA: Diagnosis not present

## 2019-05-02 DIAGNOSIS — Z23 Encounter for immunization: Secondary | ICD-10-CM | POA: Diagnosis not present

## 2019-05-02 DIAGNOSIS — N2581 Secondary hyperparathyroidism of renal origin: Secondary | ICD-10-CM | POA: Diagnosis not present

## 2019-05-02 DIAGNOSIS — D509 Iron deficiency anemia, unspecified: Secondary | ICD-10-CM | POA: Diagnosis not present

## 2019-05-02 DIAGNOSIS — D631 Anemia in chronic kidney disease: Secondary | ICD-10-CM | POA: Diagnosis not present

## 2019-05-04 DIAGNOSIS — N186 End stage renal disease: Secondary | ICD-10-CM | POA: Diagnosis not present

## 2019-05-04 DIAGNOSIS — D631 Anemia in chronic kidney disease: Secondary | ICD-10-CM | POA: Diagnosis not present

## 2019-05-04 DIAGNOSIS — N2581 Secondary hyperparathyroidism of renal origin: Secondary | ICD-10-CM | POA: Diagnosis not present

## 2019-05-04 DIAGNOSIS — D509 Iron deficiency anemia, unspecified: Secondary | ICD-10-CM | POA: Diagnosis not present

## 2019-05-04 DIAGNOSIS — Z23 Encounter for immunization: Secondary | ICD-10-CM | POA: Diagnosis not present

## 2019-05-07 DIAGNOSIS — N2581 Secondary hyperparathyroidism of renal origin: Secondary | ICD-10-CM | POA: Diagnosis not present

## 2019-05-07 DIAGNOSIS — D509 Iron deficiency anemia, unspecified: Secondary | ICD-10-CM | POA: Diagnosis not present

## 2019-05-07 DIAGNOSIS — D631 Anemia in chronic kidney disease: Secondary | ICD-10-CM | POA: Diagnosis not present

## 2019-05-07 DIAGNOSIS — N186 End stage renal disease: Secondary | ICD-10-CM | POA: Diagnosis not present

## 2019-05-07 DIAGNOSIS — Z23 Encounter for immunization: Secondary | ICD-10-CM | POA: Diagnosis not present

## 2019-05-09 DIAGNOSIS — N186 End stage renal disease: Secondary | ICD-10-CM | POA: Diagnosis not present

## 2019-05-09 DIAGNOSIS — Z23 Encounter for immunization: Secondary | ICD-10-CM | POA: Diagnosis not present

## 2019-05-09 DIAGNOSIS — D631 Anemia in chronic kidney disease: Secondary | ICD-10-CM | POA: Diagnosis not present

## 2019-05-09 DIAGNOSIS — N2581 Secondary hyperparathyroidism of renal origin: Secondary | ICD-10-CM | POA: Diagnosis not present

## 2019-05-09 DIAGNOSIS — D509 Iron deficiency anemia, unspecified: Secondary | ICD-10-CM | POA: Diagnosis not present

## 2019-05-11 DIAGNOSIS — N2581 Secondary hyperparathyroidism of renal origin: Secondary | ICD-10-CM | POA: Diagnosis not present

## 2019-05-11 DIAGNOSIS — N186 End stage renal disease: Secondary | ICD-10-CM | POA: Diagnosis not present

## 2019-05-11 DIAGNOSIS — D631 Anemia in chronic kidney disease: Secondary | ICD-10-CM | POA: Diagnosis not present

## 2019-05-11 DIAGNOSIS — D509 Iron deficiency anemia, unspecified: Secondary | ICD-10-CM | POA: Diagnosis not present

## 2019-05-11 DIAGNOSIS — Z23 Encounter for immunization: Secondary | ICD-10-CM | POA: Diagnosis not present

## 2019-05-14 DIAGNOSIS — N186 End stage renal disease: Secondary | ICD-10-CM | POA: Diagnosis not present

## 2019-05-14 DIAGNOSIS — D509 Iron deficiency anemia, unspecified: Secondary | ICD-10-CM | POA: Diagnosis not present

## 2019-05-14 DIAGNOSIS — N2581 Secondary hyperparathyroidism of renal origin: Secondary | ICD-10-CM | POA: Diagnosis not present

## 2019-05-14 DIAGNOSIS — Z23 Encounter for immunization: Secondary | ICD-10-CM | POA: Diagnosis not present

## 2019-05-14 DIAGNOSIS — D631 Anemia in chronic kidney disease: Secondary | ICD-10-CM | POA: Diagnosis not present

## 2019-05-16 DIAGNOSIS — D631 Anemia in chronic kidney disease: Secondary | ICD-10-CM | POA: Diagnosis not present

## 2019-05-16 DIAGNOSIS — N2581 Secondary hyperparathyroidism of renal origin: Secondary | ICD-10-CM | POA: Diagnosis not present

## 2019-05-16 DIAGNOSIS — Z23 Encounter for immunization: Secondary | ICD-10-CM | POA: Diagnosis not present

## 2019-05-16 DIAGNOSIS — N186 End stage renal disease: Secondary | ICD-10-CM | POA: Diagnosis not present

## 2019-05-16 DIAGNOSIS — D509 Iron deficiency anemia, unspecified: Secondary | ICD-10-CM | POA: Diagnosis not present

## 2019-05-18 DIAGNOSIS — N186 End stage renal disease: Secondary | ICD-10-CM | POA: Diagnosis not present

## 2019-05-18 DIAGNOSIS — N2581 Secondary hyperparathyroidism of renal origin: Secondary | ICD-10-CM | POA: Diagnosis not present

## 2019-05-18 DIAGNOSIS — D509 Iron deficiency anemia, unspecified: Secondary | ICD-10-CM | POA: Diagnosis not present

## 2019-05-18 DIAGNOSIS — D631 Anemia in chronic kidney disease: Secondary | ICD-10-CM | POA: Diagnosis not present

## 2019-05-18 DIAGNOSIS — Z23 Encounter for immunization: Secondary | ICD-10-CM | POA: Diagnosis not present

## 2019-05-21 DIAGNOSIS — D631 Anemia in chronic kidney disease: Secondary | ICD-10-CM | POA: Diagnosis not present

## 2019-05-21 DIAGNOSIS — N2581 Secondary hyperparathyroidism of renal origin: Secondary | ICD-10-CM | POA: Diagnosis not present

## 2019-05-21 DIAGNOSIS — D509 Iron deficiency anemia, unspecified: Secondary | ICD-10-CM | POA: Diagnosis not present

## 2019-05-21 DIAGNOSIS — N186 End stage renal disease: Secondary | ICD-10-CM | POA: Diagnosis not present

## 2019-05-21 DIAGNOSIS — Z23 Encounter for immunization: Secondary | ICD-10-CM | POA: Diagnosis not present

## 2019-05-23 DIAGNOSIS — Z992 Dependence on renal dialysis: Secondary | ICD-10-CM | POA: Diagnosis not present

## 2019-05-23 DIAGNOSIS — D509 Iron deficiency anemia, unspecified: Secondary | ICD-10-CM | POA: Diagnosis not present

## 2019-05-23 DIAGNOSIS — N186 End stage renal disease: Secondary | ICD-10-CM | POA: Diagnosis not present

## 2019-05-23 DIAGNOSIS — D631 Anemia in chronic kidney disease: Secondary | ICD-10-CM | POA: Diagnosis not present

## 2019-05-23 DIAGNOSIS — N2581 Secondary hyperparathyroidism of renal origin: Secondary | ICD-10-CM | POA: Diagnosis not present

## 2019-05-23 DIAGNOSIS — Z23 Encounter for immunization: Secondary | ICD-10-CM | POA: Diagnosis not present

## 2019-05-25 DIAGNOSIS — D509 Iron deficiency anemia, unspecified: Secondary | ICD-10-CM | POA: Diagnosis not present

## 2019-05-25 DIAGNOSIS — N186 End stage renal disease: Secondary | ICD-10-CM | POA: Diagnosis not present

## 2019-05-25 DIAGNOSIS — D631 Anemia in chronic kidney disease: Secondary | ICD-10-CM | POA: Diagnosis not present

## 2019-05-25 DIAGNOSIS — N2581 Secondary hyperparathyroidism of renal origin: Secondary | ICD-10-CM | POA: Diagnosis not present

## 2019-05-25 DIAGNOSIS — Z23 Encounter for immunization: Secondary | ICD-10-CM | POA: Diagnosis not present

## 2019-05-28 DIAGNOSIS — Z23 Encounter for immunization: Secondary | ICD-10-CM | POA: Diagnosis not present

## 2019-05-28 DIAGNOSIS — D509 Iron deficiency anemia, unspecified: Secondary | ICD-10-CM | POA: Diagnosis not present

## 2019-05-28 DIAGNOSIS — D631 Anemia in chronic kidney disease: Secondary | ICD-10-CM | POA: Diagnosis not present

## 2019-05-28 DIAGNOSIS — N2581 Secondary hyperparathyroidism of renal origin: Secondary | ICD-10-CM | POA: Diagnosis not present

## 2019-05-28 DIAGNOSIS — N186 End stage renal disease: Secondary | ICD-10-CM | POA: Diagnosis not present

## 2019-05-30 DIAGNOSIS — N2581 Secondary hyperparathyroidism of renal origin: Secondary | ICD-10-CM | POA: Diagnosis not present

## 2019-05-30 DIAGNOSIS — D509 Iron deficiency anemia, unspecified: Secondary | ICD-10-CM | POA: Diagnosis not present

## 2019-05-30 DIAGNOSIS — N186 End stage renal disease: Secondary | ICD-10-CM | POA: Diagnosis not present

## 2019-05-30 DIAGNOSIS — Z23 Encounter for immunization: Secondary | ICD-10-CM | POA: Diagnosis not present

## 2019-05-30 DIAGNOSIS — D631 Anemia in chronic kidney disease: Secondary | ICD-10-CM | POA: Diagnosis not present

## 2019-06-01 DIAGNOSIS — N186 End stage renal disease: Secondary | ICD-10-CM | POA: Diagnosis not present

## 2019-06-01 DIAGNOSIS — N2581 Secondary hyperparathyroidism of renal origin: Secondary | ICD-10-CM | POA: Diagnosis not present

## 2019-06-01 DIAGNOSIS — D509 Iron deficiency anemia, unspecified: Secondary | ICD-10-CM | POA: Diagnosis not present

## 2019-06-01 DIAGNOSIS — D631 Anemia in chronic kidney disease: Secondary | ICD-10-CM | POA: Diagnosis not present

## 2019-06-01 DIAGNOSIS — Z23 Encounter for immunization: Secondary | ICD-10-CM | POA: Diagnosis not present

## 2019-06-04 DIAGNOSIS — N186 End stage renal disease: Secondary | ICD-10-CM | POA: Diagnosis not present

## 2019-06-04 DIAGNOSIS — N2581 Secondary hyperparathyroidism of renal origin: Secondary | ICD-10-CM | POA: Diagnosis not present

## 2019-06-04 DIAGNOSIS — D631 Anemia in chronic kidney disease: Secondary | ICD-10-CM | POA: Diagnosis not present

## 2019-06-04 DIAGNOSIS — D509 Iron deficiency anemia, unspecified: Secondary | ICD-10-CM | POA: Diagnosis not present

## 2019-06-04 DIAGNOSIS — Z23 Encounter for immunization: Secondary | ICD-10-CM | POA: Diagnosis not present

## 2019-06-08 DIAGNOSIS — D509 Iron deficiency anemia, unspecified: Secondary | ICD-10-CM | POA: Diagnosis not present

## 2019-06-08 DIAGNOSIS — Z23 Encounter for immunization: Secondary | ICD-10-CM | POA: Diagnosis not present

## 2019-06-08 DIAGNOSIS — N2581 Secondary hyperparathyroidism of renal origin: Secondary | ICD-10-CM | POA: Diagnosis not present

## 2019-06-08 DIAGNOSIS — N186 End stage renal disease: Secondary | ICD-10-CM | POA: Diagnosis not present

## 2019-06-08 DIAGNOSIS — D631 Anemia in chronic kidney disease: Secondary | ICD-10-CM | POA: Diagnosis not present

## 2019-06-11 DIAGNOSIS — D631 Anemia in chronic kidney disease: Secondary | ICD-10-CM | POA: Diagnosis not present

## 2019-06-11 DIAGNOSIS — N186 End stage renal disease: Secondary | ICD-10-CM | POA: Diagnosis not present

## 2019-06-11 DIAGNOSIS — N2581 Secondary hyperparathyroidism of renal origin: Secondary | ICD-10-CM | POA: Diagnosis not present

## 2019-06-11 DIAGNOSIS — Z23 Encounter for immunization: Secondary | ICD-10-CM | POA: Diagnosis not present

## 2019-06-11 DIAGNOSIS — D509 Iron deficiency anemia, unspecified: Secondary | ICD-10-CM | POA: Diagnosis not present

## 2019-06-13 DIAGNOSIS — D509 Iron deficiency anemia, unspecified: Secondary | ICD-10-CM | POA: Diagnosis not present

## 2019-06-13 DIAGNOSIS — N186 End stage renal disease: Secondary | ICD-10-CM | POA: Diagnosis not present

## 2019-06-13 DIAGNOSIS — N2581 Secondary hyperparathyroidism of renal origin: Secondary | ICD-10-CM | POA: Diagnosis not present

## 2019-06-13 DIAGNOSIS — D631 Anemia in chronic kidney disease: Secondary | ICD-10-CM | POA: Diagnosis not present

## 2019-06-13 DIAGNOSIS — Z23 Encounter for immunization: Secondary | ICD-10-CM | POA: Diagnosis not present

## 2019-06-15 DIAGNOSIS — D631 Anemia in chronic kidney disease: Secondary | ICD-10-CM | POA: Diagnosis not present

## 2019-06-15 DIAGNOSIS — N2581 Secondary hyperparathyroidism of renal origin: Secondary | ICD-10-CM | POA: Diagnosis not present

## 2019-06-15 DIAGNOSIS — N186 End stage renal disease: Secondary | ICD-10-CM | POA: Diagnosis not present

## 2019-06-15 DIAGNOSIS — Z23 Encounter for immunization: Secondary | ICD-10-CM | POA: Diagnosis not present

## 2019-06-15 DIAGNOSIS — D509 Iron deficiency anemia, unspecified: Secondary | ICD-10-CM | POA: Diagnosis not present

## 2019-06-18 DIAGNOSIS — D631 Anemia in chronic kidney disease: Secondary | ICD-10-CM | POA: Diagnosis not present

## 2019-06-18 DIAGNOSIS — Z23 Encounter for immunization: Secondary | ICD-10-CM | POA: Diagnosis not present

## 2019-06-18 DIAGNOSIS — D509 Iron deficiency anemia, unspecified: Secondary | ICD-10-CM | POA: Diagnosis not present

## 2019-06-18 DIAGNOSIS — N2581 Secondary hyperparathyroidism of renal origin: Secondary | ICD-10-CM | POA: Diagnosis not present

## 2019-06-18 DIAGNOSIS — N186 End stage renal disease: Secondary | ICD-10-CM | POA: Diagnosis not present

## 2019-06-20 DIAGNOSIS — D631 Anemia in chronic kidney disease: Secondary | ICD-10-CM | POA: Diagnosis not present

## 2019-06-20 DIAGNOSIS — N2581 Secondary hyperparathyroidism of renal origin: Secondary | ICD-10-CM | POA: Diagnosis not present

## 2019-06-20 DIAGNOSIS — N186 End stage renal disease: Secondary | ICD-10-CM | POA: Diagnosis not present

## 2019-06-20 DIAGNOSIS — D509 Iron deficiency anemia, unspecified: Secondary | ICD-10-CM | POA: Diagnosis not present

## 2019-06-20 DIAGNOSIS — Z23 Encounter for immunization: Secondary | ICD-10-CM | POA: Diagnosis not present

## 2019-06-22 DIAGNOSIS — N186 End stage renal disease: Secondary | ICD-10-CM | POA: Diagnosis not present

## 2019-06-22 DIAGNOSIS — Z23 Encounter for immunization: Secondary | ICD-10-CM | POA: Diagnosis not present

## 2019-06-22 DIAGNOSIS — Z992 Dependence on renal dialysis: Secondary | ICD-10-CM | POA: Diagnosis not present

## 2019-06-22 DIAGNOSIS — D509 Iron deficiency anemia, unspecified: Secondary | ICD-10-CM | POA: Diagnosis not present

## 2019-06-22 DIAGNOSIS — D631 Anemia in chronic kidney disease: Secondary | ICD-10-CM | POA: Diagnosis not present

## 2019-06-22 DIAGNOSIS — N2581 Secondary hyperparathyroidism of renal origin: Secondary | ICD-10-CM | POA: Diagnosis not present

## 2019-06-25 DIAGNOSIS — N186 End stage renal disease: Secondary | ICD-10-CM | POA: Diagnosis not present

## 2019-06-25 DIAGNOSIS — N2581 Secondary hyperparathyroidism of renal origin: Secondary | ICD-10-CM | POA: Diagnosis not present

## 2019-06-25 DIAGNOSIS — D631 Anemia in chronic kidney disease: Secondary | ICD-10-CM | POA: Diagnosis not present

## 2019-06-25 DIAGNOSIS — D509 Iron deficiency anemia, unspecified: Secondary | ICD-10-CM | POA: Diagnosis not present

## 2019-06-27 DIAGNOSIS — N2581 Secondary hyperparathyroidism of renal origin: Secondary | ICD-10-CM | POA: Diagnosis not present

## 2019-06-27 DIAGNOSIS — D509 Iron deficiency anemia, unspecified: Secondary | ICD-10-CM | POA: Diagnosis not present

## 2019-06-27 DIAGNOSIS — D631 Anemia in chronic kidney disease: Secondary | ICD-10-CM | POA: Diagnosis not present

## 2019-06-27 DIAGNOSIS — N186 End stage renal disease: Secondary | ICD-10-CM | POA: Diagnosis not present

## 2019-06-29 DIAGNOSIS — N2581 Secondary hyperparathyroidism of renal origin: Secondary | ICD-10-CM | POA: Diagnosis not present

## 2019-06-29 DIAGNOSIS — D631 Anemia in chronic kidney disease: Secondary | ICD-10-CM | POA: Diagnosis not present

## 2019-06-29 DIAGNOSIS — N186 End stage renal disease: Secondary | ICD-10-CM | POA: Diagnosis not present

## 2019-06-29 DIAGNOSIS — D509 Iron deficiency anemia, unspecified: Secondary | ICD-10-CM | POA: Diagnosis not present

## 2019-07-02 DIAGNOSIS — D509 Iron deficiency anemia, unspecified: Secondary | ICD-10-CM | POA: Diagnosis not present

## 2019-07-02 DIAGNOSIS — D631 Anemia in chronic kidney disease: Secondary | ICD-10-CM | POA: Diagnosis not present

## 2019-07-02 DIAGNOSIS — N186 End stage renal disease: Secondary | ICD-10-CM | POA: Diagnosis not present

## 2019-07-02 DIAGNOSIS — N2581 Secondary hyperparathyroidism of renal origin: Secondary | ICD-10-CM | POA: Diagnosis not present

## 2019-07-04 DIAGNOSIS — D631 Anemia in chronic kidney disease: Secondary | ICD-10-CM | POA: Diagnosis not present

## 2019-07-04 DIAGNOSIS — D509 Iron deficiency anemia, unspecified: Secondary | ICD-10-CM | POA: Diagnosis not present

## 2019-07-04 DIAGNOSIS — N2581 Secondary hyperparathyroidism of renal origin: Secondary | ICD-10-CM | POA: Diagnosis not present

## 2019-07-04 DIAGNOSIS — N186 End stage renal disease: Secondary | ICD-10-CM | POA: Diagnosis not present

## 2019-07-06 DIAGNOSIS — D509 Iron deficiency anemia, unspecified: Secondary | ICD-10-CM | POA: Diagnosis not present

## 2019-07-06 DIAGNOSIS — Z114 Encounter for screening for human immunodeficiency virus [HIV]: Secondary | ICD-10-CM | POA: Diagnosis not present

## 2019-07-06 DIAGNOSIS — N2581 Secondary hyperparathyroidism of renal origin: Secondary | ICD-10-CM | POA: Diagnosis not present

## 2019-07-06 DIAGNOSIS — D631 Anemia in chronic kidney disease: Secondary | ICD-10-CM | POA: Diagnosis not present

## 2019-07-06 DIAGNOSIS — N186 End stage renal disease: Secondary | ICD-10-CM | POA: Diagnosis not present

## 2019-07-09 DIAGNOSIS — D631 Anemia in chronic kidney disease: Secondary | ICD-10-CM | POA: Diagnosis not present

## 2019-07-09 DIAGNOSIS — N2581 Secondary hyperparathyroidism of renal origin: Secondary | ICD-10-CM | POA: Diagnosis not present

## 2019-07-09 DIAGNOSIS — D509 Iron deficiency anemia, unspecified: Secondary | ICD-10-CM | POA: Diagnosis not present

## 2019-07-09 DIAGNOSIS — N186 End stage renal disease: Secondary | ICD-10-CM | POA: Diagnosis not present

## 2019-07-11 DIAGNOSIS — N186 End stage renal disease: Secondary | ICD-10-CM | POA: Diagnosis not present

## 2019-07-11 DIAGNOSIS — D631 Anemia in chronic kidney disease: Secondary | ICD-10-CM | POA: Diagnosis not present

## 2019-07-11 DIAGNOSIS — N2581 Secondary hyperparathyroidism of renal origin: Secondary | ICD-10-CM | POA: Diagnosis not present

## 2019-07-11 DIAGNOSIS — D509 Iron deficiency anemia, unspecified: Secondary | ICD-10-CM | POA: Diagnosis not present

## 2019-07-13 DIAGNOSIS — N186 End stage renal disease: Secondary | ICD-10-CM | POA: Diagnosis not present

## 2019-07-13 DIAGNOSIS — D509 Iron deficiency anemia, unspecified: Secondary | ICD-10-CM | POA: Diagnosis not present

## 2019-07-13 DIAGNOSIS — D631 Anemia in chronic kidney disease: Secondary | ICD-10-CM | POA: Diagnosis not present

## 2019-07-13 DIAGNOSIS — N2581 Secondary hyperparathyroidism of renal origin: Secondary | ICD-10-CM | POA: Diagnosis not present

## 2019-07-16 DIAGNOSIS — D631 Anemia in chronic kidney disease: Secondary | ICD-10-CM | POA: Diagnosis not present

## 2019-07-16 DIAGNOSIS — N186 End stage renal disease: Secondary | ICD-10-CM | POA: Diagnosis not present

## 2019-07-16 DIAGNOSIS — N2581 Secondary hyperparathyroidism of renal origin: Secondary | ICD-10-CM | POA: Diagnosis not present

## 2019-07-16 DIAGNOSIS — D509 Iron deficiency anemia, unspecified: Secondary | ICD-10-CM | POA: Diagnosis not present

## 2019-07-18 DIAGNOSIS — D631 Anemia in chronic kidney disease: Secondary | ICD-10-CM | POA: Diagnosis not present

## 2019-07-18 DIAGNOSIS — N2581 Secondary hyperparathyroidism of renal origin: Secondary | ICD-10-CM | POA: Diagnosis not present

## 2019-07-18 DIAGNOSIS — D509 Iron deficiency anemia, unspecified: Secondary | ICD-10-CM | POA: Diagnosis not present

## 2019-07-18 DIAGNOSIS — N186 End stage renal disease: Secondary | ICD-10-CM | POA: Diagnosis not present

## 2019-07-20 DIAGNOSIS — D509 Iron deficiency anemia, unspecified: Secondary | ICD-10-CM | POA: Diagnosis not present

## 2019-07-20 DIAGNOSIS — N2581 Secondary hyperparathyroidism of renal origin: Secondary | ICD-10-CM | POA: Diagnosis not present

## 2019-07-20 DIAGNOSIS — N186 End stage renal disease: Secondary | ICD-10-CM | POA: Diagnosis not present

## 2019-07-20 DIAGNOSIS — D631 Anemia in chronic kidney disease: Secondary | ICD-10-CM | POA: Diagnosis not present

## 2019-07-23 DIAGNOSIS — N2581 Secondary hyperparathyroidism of renal origin: Secondary | ICD-10-CM | POA: Diagnosis not present

## 2019-07-23 DIAGNOSIS — Z992 Dependence on renal dialysis: Secondary | ICD-10-CM | POA: Diagnosis not present

## 2019-07-23 DIAGNOSIS — D509 Iron deficiency anemia, unspecified: Secondary | ICD-10-CM | POA: Diagnosis not present

## 2019-07-23 DIAGNOSIS — D631 Anemia in chronic kidney disease: Secondary | ICD-10-CM | POA: Diagnosis not present

## 2019-07-23 DIAGNOSIS — N186 End stage renal disease: Secondary | ICD-10-CM | POA: Diagnosis not present

## 2019-08-24 DIAGNOSIS — D631 Anemia in chronic kidney disease: Secondary | ICD-10-CM | POA: Diagnosis not present

## 2019-08-24 DIAGNOSIS — N186 End stage renal disease: Secondary | ICD-10-CM | POA: Diagnosis not present

## 2019-08-24 DIAGNOSIS — D509 Iron deficiency anemia, unspecified: Secondary | ICD-10-CM | POA: Diagnosis not present

## 2019-08-24 DIAGNOSIS — E8779 Other fluid overload: Secondary | ICD-10-CM | POA: Diagnosis not present

## 2019-08-24 DIAGNOSIS — N2581 Secondary hyperparathyroidism of renal origin: Secondary | ICD-10-CM | POA: Diagnosis not present

## 2019-08-27 DIAGNOSIS — E8779 Other fluid overload: Secondary | ICD-10-CM | POA: Diagnosis not present

## 2019-08-27 DIAGNOSIS — D631 Anemia in chronic kidney disease: Secondary | ICD-10-CM | POA: Diagnosis not present

## 2019-08-27 DIAGNOSIS — N186 End stage renal disease: Secondary | ICD-10-CM | POA: Diagnosis not present

## 2019-08-27 DIAGNOSIS — D509 Iron deficiency anemia, unspecified: Secondary | ICD-10-CM | POA: Diagnosis not present

## 2019-08-27 DIAGNOSIS — N2581 Secondary hyperparathyroidism of renal origin: Secondary | ICD-10-CM | POA: Diagnosis not present

## 2019-08-29 DIAGNOSIS — E8779 Other fluid overload: Secondary | ICD-10-CM | POA: Diagnosis not present

## 2019-08-29 DIAGNOSIS — D509 Iron deficiency anemia, unspecified: Secondary | ICD-10-CM | POA: Diagnosis not present

## 2019-08-29 DIAGNOSIS — D631 Anemia in chronic kidney disease: Secondary | ICD-10-CM | POA: Diagnosis not present

## 2019-08-29 DIAGNOSIS — N2581 Secondary hyperparathyroidism of renal origin: Secondary | ICD-10-CM | POA: Diagnosis not present

## 2019-08-29 DIAGNOSIS — N186 End stage renal disease: Secondary | ICD-10-CM | POA: Diagnosis not present

## 2019-08-31 DIAGNOSIS — N2581 Secondary hyperparathyroidism of renal origin: Secondary | ICD-10-CM | POA: Diagnosis not present

## 2019-08-31 DIAGNOSIS — D631 Anemia in chronic kidney disease: Secondary | ICD-10-CM | POA: Diagnosis not present

## 2019-08-31 DIAGNOSIS — E8779 Other fluid overload: Secondary | ICD-10-CM | POA: Diagnosis not present

## 2019-08-31 DIAGNOSIS — D509 Iron deficiency anemia, unspecified: Secondary | ICD-10-CM | POA: Diagnosis not present

## 2019-08-31 DIAGNOSIS — N186 End stage renal disease: Secondary | ICD-10-CM | POA: Diagnosis not present

## 2019-09-03 DIAGNOSIS — D631 Anemia in chronic kidney disease: Secondary | ICD-10-CM | POA: Diagnosis not present

## 2019-09-03 DIAGNOSIS — D509 Iron deficiency anemia, unspecified: Secondary | ICD-10-CM | POA: Diagnosis not present

## 2019-09-03 DIAGNOSIS — E8779 Other fluid overload: Secondary | ICD-10-CM | POA: Diagnosis not present

## 2019-09-03 DIAGNOSIS — N2581 Secondary hyperparathyroidism of renal origin: Secondary | ICD-10-CM | POA: Diagnosis not present

## 2019-09-03 DIAGNOSIS — N186 End stage renal disease: Secondary | ICD-10-CM | POA: Diagnosis not present

## 2019-09-05 DIAGNOSIS — D509 Iron deficiency anemia, unspecified: Secondary | ICD-10-CM | POA: Diagnosis not present

## 2019-09-05 DIAGNOSIS — N2581 Secondary hyperparathyroidism of renal origin: Secondary | ICD-10-CM | POA: Diagnosis not present

## 2019-09-05 DIAGNOSIS — N186 End stage renal disease: Secondary | ICD-10-CM | POA: Diagnosis not present

## 2019-09-05 DIAGNOSIS — E8779 Other fluid overload: Secondary | ICD-10-CM | POA: Diagnosis not present

## 2019-09-05 DIAGNOSIS — D631 Anemia in chronic kidney disease: Secondary | ICD-10-CM | POA: Diagnosis not present

## 2019-09-07 DIAGNOSIS — E8779 Other fluid overload: Secondary | ICD-10-CM | POA: Diagnosis not present

## 2019-09-07 DIAGNOSIS — D631 Anemia in chronic kidney disease: Secondary | ICD-10-CM | POA: Diagnosis not present

## 2019-09-07 DIAGNOSIS — N186 End stage renal disease: Secondary | ICD-10-CM | POA: Diagnosis not present

## 2019-09-07 DIAGNOSIS — D509 Iron deficiency anemia, unspecified: Secondary | ICD-10-CM | POA: Diagnosis not present

## 2019-09-07 DIAGNOSIS — N2581 Secondary hyperparathyroidism of renal origin: Secondary | ICD-10-CM | POA: Diagnosis not present

## 2019-09-10 DIAGNOSIS — N186 End stage renal disease: Secondary | ICD-10-CM | POA: Diagnosis not present

## 2019-09-10 DIAGNOSIS — D631 Anemia in chronic kidney disease: Secondary | ICD-10-CM | POA: Diagnosis not present

## 2019-09-10 DIAGNOSIS — D509 Iron deficiency anemia, unspecified: Secondary | ICD-10-CM | POA: Diagnosis not present

## 2019-09-10 DIAGNOSIS — E8779 Other fluid overload: Secondary | ICD-10-CM | POA: Diagnosis not present

## 2019-09-10 DIAGNOSIS — N2581 Secondary hyperparathyroidism of renal origin: Secondary | ICD-10-CM | POA: Diagnosis not present

## 2019-09-12 DIAGNOSIS — N186 End stage renal disease: Secondary | ICD-10-CM | POA: Diagnosis not present

## 2019-09-12 DIAGNOSIS — N2581 Secondary hyperparathyroidism of renal origin: Secondary | ICD-10-CM | POA: Diagnosis not present

## 2019-09-12 DIAGNOSIS — E8779 Other fluid overload: Secondary | ICD-10-CM | POA: Diagnosis not present

## 2019-09-12 DIAGNOSIS — D509 Iron deficiency anemia, unspecified: Secondary | ICD-10-CM | POA: Diagnosis not present

## 2019-09-12 DIAGNOSIS — D631 Anemia in chronic kidney disease: Secondary | ICD-10-CM | POA: Diagnosis not present

## 2019-09-14 DIAGNOSIS — D631 Anemia in chronic kidney disease: Secondary | ICD-10-CM | POA: Diagnosis not present

## 2019-09-14 DIAGNOSIS — E8779 Other fluid overload: Secondary | ICD-10-CM | POA: Diagnosis not present

## 2019-09-14 DIAGNOSIS — N186 End stage renal disease: Secondary | ICD-10-CM | POA: Diagnosis not present

## 2019-09-14 DIAGNOSIS — N2581 Secondary hyperparathyroidism of renal origin: Secondary | ICD-10-CM | POA: Diagnosis not present

## 2019-09-14 DIAGNOSIS — D509 Iron deficiency anemia, unspecified: Secondary | ICD-10-CM | POA: Diagnosis not present

## 2019-09-17 DIAGNOSIS — N186 End stage renal disease: Secondary | ICD-10-CM | POA: Diagnosis not present

## 2019-09-17 DIAGNOSIS — E8779 Other fluid overload: Secondary | ICD-10-CM | POA: Diagnosis not present

## 2019-09-17 DIAGNOSIS — D631 Anemia in chronic kidney disease: Secondary | ICD-10-CM | POA: Diagnosis not present

## 2019-09-17 DIAGNOSIS — N2581 Secondary hyperparathyroidism of renal origin: Secondary | ICD-10-CM | POA: Diagnosis not present

## 2019-09-17 DIAGNOSIS — D509 Iron deficiency anemia, unspecified: Secondary | ICD-10-CM | POA: Diagnosis not present

## 2019-09-18 DIAGNOSIS — E8779 Other fluid overload: Secondary | ICD-10-CM | POA: Diagnosis not present

## 2019-09-18 DIAGNOSIS — D631 Anemia in chronic kidney disease: Secondary | ICD-10-CM | POA: Diagnosis not present

## 2019-09-18 DIAGNOSIS — N186 End stage renal disease: Secondary | ICD-10-CM | POA: Diagnosis not present

## 2019-09-18 DIAGNOSIS — N2581 Secondary hyperparathyroidism of renal origin: Secondary | ICD-10-CM | POA: Diagnosis not present

## 2019-09-18 DIAGNOSIS — D509 Iron deficiency anemia, unspecified: Secondary | ICD-10-CM | POA: Diagnosis not present

## 2019-09-21 DIAGNOSIS — E8779 Other fluid overload: Secondary | ICD-10-CM | POA: Diagnosis not present

## 2019-09-21 DIAGNOSIS — N186 End stage renal disease: Secondary | ICD-10-CM | POA: Diagnosis not present

## 2019-09-21 DIAGNOSIS — D631 Anemia in chronic kidney disease: Secondary | ICD-10-CM | POA: Diagnosis not present

## 2019-09-21 DIAGNOSIS — D509 Iron deficiency anemia, unspecified: Secondary | ICD-10-CM | POA: Diagnosis not present

## 2019-09-21 DIAGNOSIS — N2581 Secondary hyperparathyroidism of renal origin: Secondary | ICD-10-CM | POA: Diagnosis not present

## 2019-09-22 DIAGNOSIS — Z992 Dependence on renal dialysis: Secondary | ICD-10-CM | POA: Diagnosis not present

## 2019-09-22 DIAGNOSIS — N186 End stage renal disease: Secondary | ICD-10-CM | POA: Diagnosis not present

## 2019-09-24 DIAGNOSIS — N2581 Secondary hyperparathyroidism of renal origin: Secondary | ICD-10-CM | POA: Diagnosis not present

## 2019-09-24 DIAGNOSIS — N186 End stage renal disease: Secondary | ICD-10-CM | POA: Diagnosis not present

## 2019-09-24 DIAGNOSIS — D631 Anemia in chronic kidney disease: Secondary | ICD-10-CM | POA: Diagnosis not present

## 2019-09-26 DIAGNOSIS — N2581 Secondary hyperparathyroidism of renal origin: Secondary | ICD-10-CM | POA: Diagnosis not present

## 2019-09-26 DIAGNOSIS — N186 End stage renal disease: Secondary | ICD-10-CM | POA: Diagnosis not present

## 2019-09-26 DIAGNOSIS — D631 Anemia in chronic kidney disease: Secondary | ICD-10-CM | POA: Diagnosis not present

## 2019-09-28 DIAGNOSIS — N186 End stage renal disease: Secondary | ICD-10-CM | POA: Diagnosis not present

## 2019-09-28 DIAGNOSIS — N2581 Secondary hyperparathyroidism of renal origin: Secondary | ICD-10-CM | POA: Diagnosis not present

## 2019-09-28 DIAGNOSIS — D631 Anemia in chronic kidney disease: Secondary | ICD-10-CM | POA: Diagnosis not present

## 2019-10-01 DIAGNOSIS — N186 End stage renal disease: Secondary | ICD-10-CM | POA: Diagnosis not present

## 2019-10-01 DIAGNOSIS — D631 Anemia in chronic kidney disease: Secondary | ICD-10-CM | POA: Diagnosis not present

## 2019-10-01 DIAGNOSIS — N2581 Secondary hyperparathyroidism of renal origin: Secondary | ICD-10-CM | POA: Diagnosis not present

## 2019-10-03 DIAGNOSIS — N2581 Secondary hyperparathyroidism of renal origin: Secondary | ICD-10-CM | POA: Diagnosis not present

## 2019-10-03 DIAGNOSIS — D631 Anemia in chronic kidney disease: Secondary | ICD-10-CM | POA: Diagnosis not present

## 2019-10-03 DIAGNOSIS — N186 End stage renal disease: Secondary | ICD-10-CM | POA: Diagnosis not present

## 2019-10-05 DIAGNOSIS — N186 End stage renal disease: Secondary | ICD-10-CM | POA: Diagnosis not present

## 2019-10-05 DIAGNOSIS — N2581 Secondary hyperparathyroidism of renal origin: Secondary | ICD-10-CM | POA: Diagnosis not present

## 2019-10-05 DIAGNOSIS — D631 Anemia in chronic kidney disease: Secondary | ICD-10-CM | POA: Diagnosis not present

## 2019-10-08 DIAGNOSIS — N2581 Secondary hyperparathyroidism of renal origin: Secondary | ICD-10-CM | POA: Diagnosis not present

## 2019-10-08 DIAGNOSIS — D631 Anemia in chronic kidney disease: Secondary | ICD-10-CM | POA: Diagnosis not present

## 2019-10-08 DIAGNOSIS — N186 End stage renal disease: Secondary | ICD-10-CM | POA: Diagnosis not present

## 2019-10-10 DIAGNOSIS — D631 Anemia in chronic kidney disease: Secondary | ICD-10-CM | POA: Diagnosis not present

## 2019-10-10 DIAGNOSIS — N186 End stage renal disease: Secondary | ICD-10-CM | POA: Diagnosis not present

## 2019-10-10 DIAGNOSIS — N2581 Secondary hyperparathyroidism of renal origin: Secondary | ICD-10-CM | POA: Diagnosis not present

## 2019-10-12 DIAGNOSIS — D631 Anemia in chronic kidney disease: Secondary | ICD-10-CM | POA: Diagnosis not present

## 2019-10-12 DIAGNOSIS — N186 End stage renal disease: Secondary | ICD-10-CM | POA: Diagnosis not present

## 2019-10-12 DIAGNOSIS — N2581 Secondary hyperparathyroidism of renal origin: Secondary | ICD-10-CM | POA: Diagnosis not present

## 2019-10-15 DIAGNOSIS — D631 Anemia in chronic kidney disease: Secondary | ICD-10-CM | POA: Diagnosis not present

## 2019-10-15 DIAGNOSIS — N186 End stage renal disease: Secondary | ICD-10-CM | POA: Diagnosis not present

## 2019-10-15 DIAGNOSIS — N2581 Secondary hyperparathyroidism of renal origin: Secondary | ICD-10-CM | POA: Diagnosis not present

## 2019-10-17 DIAGNOSIS — D631 Anemia in chronic kidney disease: Secondary | ICD-10-CM | POA: Diagnosis not present

## 2019-10-17 DIAGNOSIS — N2581 Secondary hyperparathyroidism of renal origin: Secondary | ICD-10-CM | POA: Diagnosis not present

## 2019-10-17 DIAGNOSIS — N186 End stage renal disease: Secondary | ICD-10-CM | POA: Diagnosis not present

## 2019-10-19 DIAGNOSIS — N186 End stage renal disease: Secondary | ICD-10-CM | POA: Diagnosis not present

## 2019-10-19 DIAGNOSIS — D631 Anemia in chronic kidney disease: Secondary | ICD-10-CM | POA: Diagnosis not present

## 2019-10-19 DIAGNOSIS — N2581 Secondary hyperparathyroidism of renal origin: Secondary | ICD-10-CM | POA: Diagnosis not present

## 2019-10-22 DIAGNOSIS — N2581 Secondary hyperparathyroidism of renal origin: Secondary | ICD-10-CM | POA: Diagnosis not present

## 2019-10-22 DIAGNOSIS — N186 End stage renal disease: Secondary | ICD-10-CM | POA: Diagnosis not present

## 2019-10-22 DIAGNOSIS — D631 Anemia in chronic kidney disease: Secondary | ICD-10-CM | POA: Diagnosis not present

## 2019-10-23 DIAGNOSIS — Z992 Dependence on renal dialysis: Secondary | ICD-10-CM | POA: Diagnosis not present

## 2019-10-23 DIAGNOSIS — N186 End stage renal disease: Secondary | ICD-10-CM | POA: Diagnosis not present

## 2019-10-24 DIAGNOSIS — N186 End stage renal disease: Secondary | ICD-10-CM | POA: Diagnosis not present

## 2019-10-24 DIAGNOSIS — E8779 Other fluid overload: Secondary | ICD-10-CM | POA: Diagnosis not present

## 2019-10-24 DIAGNOSIS — N2581 Secondary hyperparathyroidism of renal origin: Secondary | ICD-10-CM | POA: Diagnosis not present

## 2019-10-24 DIAGNOSIS — Z23 Encounter for immunization: Secondary | ICD-10-CM | POA: Diagnosis not present

## 2019-10-24 DIAGNOSIS — D631 Anemia in chronic kidney disease: Secondary | ICD-10-CM | POA: Diagnosis not present

## 2019-10-26 DIAGNOSIS — D631 Anemia in chronic kidney disease: Secondary | ICD-10-CM | POA: Diagnosis not present

## 2019-10-26 DIAGNOSIS — N2581 Secondary hyperparathyroidism of renal origin: Secondary | ICD-10-CM | POA: Diagnosis not present

## 2019-10-26 DIAGNOSIS — Z23 Encounter for immunization: Secondary | ICD-10-CM | POA: Diagnosis not present

## 2019-10-26 DIAGNOSIS — E8779 Other fluid overload: Secondary | ICD-10-CM | POA: Diagnosis not present

## 2019-10-26 DIAGNOSIS — N186 End stage renal disease: Secondary | ICD-10-CM | POA: Diagnosis not present

## 2019-10-29 DIAGNOSIS — E8779 Other fluid overload: Secondary | ICD-10-CM | POA: Diagnosis not present

## 2019-10-29 DIAGNOSIS — N186 End stage renal disease: Secondary | ICD-10-CM | POA: Diagnosis not present

## 2019-10-29 DIAGNOSIS — Z23 Encounter for immunization: Secondary | ICD-10-CM | POA: Diagnosis not present

## 2019-10-29 DIAGNOSIS — N2581 Secondary hyperparathyroidism of renal origin: Secondary | ICD-10-CM | POA: Diagnosis not present

## 2019-10-29 DIAGNOSIS — D631 Anemia in chronic kidney disease: Secondary | ICD-10-CM | POA: Diagnosis not present

## 2019-10-30 DIAGNOSIS — D631 Anemia in chronic kidney disease: Secondary | ICD-10-CM | POA: Diagnosis not present

## 2019-10-30 DIAGNOSIS — Z23 Encounter for immunization: Secondary | ICD-10-CM | POA: Diagnosis not present

## 2019-10-30 DIAGNOSIS — N186 End stage renal disease: Secondary | ICD-10-CM | POA: Diagnosis not present

## 2019-10-30 DIAGNOSIS — N2581 Secondary hyperparathyroidism of renal origin: Secondary | ICD-10-CM | POA: Diagnosis not present

## 2019-10-30 DIAGNOSIS — E8779 Other fluid overload: Secondary | ICD-10-CM | POA: Diagnosis not present

## 2019-10-31 DIAGNOSIS — Z23 Encounter for immunization: Secondary | ICD-10-CM | POA: Diagnosis not present

## 2019-10-31 DIAGNOSIS — E8779 Other fluid overload: Secondary | ICD-10-CM | POA: Diagnosis not present

## 2019-10-31 DIAGNOSIS — N186 End stage renal disease: Secondary | ICD-10-CM | POA: Diagnosis not present

## 2019-10-31 DIAGNOSIS — N2581 Secondary hyperparathyroidism of renal origin: Secondary | ICD-10-CM | POA: Diagnosis not present

## 2019-10-31 DIAGNOSIS — D631 Anemia in chronic kidney disease: Secondary | ICD-10-CM | POA: Diagnosis not present

## 2019-11-02 DIAGNOSIS — E8779 Other fluid overload: Secondary | ICD-10-CM | POA: Diagnosis not present

## 2019-11-02 DIAGNOSIS — N186 End stage renal disease: Secondary | ICD-10-CM | POA: Diagnosis not present

## 2019-11-02 DIAGNOSIS — D631 Anemia in chronic kidney disease: Secondary | ICD-10-CM | POA: Diagnosis not present

## 2019-11-02 DIAGNOSIS — N2581 Secondary hyperparathyroidism of renal origin: Secondary | ICD-10-CM | POA: Diagnosis not present

## 2019-11-02 DIAGNOSIS — Z23 Encounter for immunization: Secondary | ICD-10-CM | POA: Diagnosis not present

## 2019-11-05 DIAGNOSIS — D631 Anemia in chronic kidney disease: Secondary | ICD-10-CM | POA: Diagnosis not present

## 2019-11-05 DIAGNOSIS — N2581 Secondary hyperparathyroidism of renal origin: Secondary | ICD-10-CM | POA: Diagnosis not present

## 2019-11-05 DIAGNOSIS — N186 End stage renal disease: Secondary | ICD-10-CM | POA: Diagnosis not present

## 2019-11-05 DIAGNOSIS — E8779 Other fluid overload: Secondary | ICD-10-CM | POA: Diagnosis not present

## 2019-11-05 DIAGNOSIS — Z23 Encounter for immunization: Secondary | ICD-10-CM | POA: Diagnosis not present

## 2019-11-07 DIAGNOSIS — N186 End stage renal disease: Secondary | ICD-10-CM | POA: Diagnosis not present

## 2019-11-07 DIAGNOSIS — D631 Anemia in chronic kidney disease: Secondary | ICD-10-CM | POA: Diagnosis not present

## 2019-11-07 DIAGNOSIS — Z23 Encounter for immunization: Secondary | ICD-10-CM | POA: Diagnosis not present

## 2019-11-07 DIAGNOSIS — N2581 Secondary hyperparathyroidism of renal origin: Secondary | ICD-10-CM | POA: Diagnosis not present

## 2019-11-07 DIAGNOSIS — E8779 Other fluid overload: Secondary | ICD-10-CM | POA: Diagnosis not present

## 2019-11-09 DIAGNOSIS — E8779 Other fluid overload: Secondary | ICD-10-CM | POA: Diagnosis not present

## 2019-11-09 DIAGNOSIS — Z23 Encounter for immunization: Secondary | ICD-10-CM | POA: Diagnosis not present

## 2019-11-09 DIAGNOSIS — D631 Anemia in chronic kidney disease: Secondary | ICD-10-CM | POA: Diagnosis not present

## 2019-11-09 DIAGNOSIS — N186 End stage renal disease: Secondary | ICD-10-CM | POA: Diagnosis not present

## 2019-11-09 DIAGNOSIS — N2581 Secondary hyperparathyroidism of renal origin: Secondary | ICD-10-CM | POA: Diagnosis not present

## 2019-11-12 DIAGNOSIS — E8779 Other fluid overload: Secondary | ICD-10-CM | POA: Diagnosis not present

## 2019-11-12 DIAGNOSIS — N186 End stage renal disease: Secondary | ICD-10-CM | POA: Diagnosis not present

## 2019-11-12 DIAGNOSIS — N2581 Secondary hyperparathyroidism of renal origin: Secondary | ICD-10-CM | POA: Diagnosis not present

## 2019-11-12 DIAGNOSIS — D631 Anemia in chronic kidney disease: Secondary | ICD-10-CM | POA: Diagnosis not present

## 2019-11-12 DIAGNOSIS — Z23 Encounter for immunization: Secondary | ICD-10-CM | POA: Diagnosis not present

## 2019-11-14 DIAGNOSIS — Z23 Encounter for immunization: Secondary | ICD-10-CM | POA: Diagnosis not present

## 2019-11-14 DIAGNOSIS — D631 Anemia in chronic kidney disease: Secondary | ICD-10-CM | POA: Diagnosis not present

## 2019-11-14 DIAGNOSIS — N186 End stage renal disease: Secondary | ICD-10-CM | POA: Diagnosis not present

## 2019-11-14 DIAGNOSIS — N2581 Secondary hyperparathyroidism of renal origin: Secondary | ICD-10-CM | POA: Diagnosis not present

## 2019-11-14 DIAGNOSIS — E8779 Other fluid overload: Secondary | ICD-10-CM | POA: Diagnosis not present

## 2019-11-16 DIAGNOSIS — N186 End stage renal disease: Secondary | ICD-10-CM | POA: Diagnosis not present

## 2019-11-16 DIAGNOSIS — D631 Anemia in chronic kidney disease: Secondary | ICD-10-CM | POA: Diagnosis not present

## 2019-11-16 DIAGNOSIS — Z23 Encounter for immunization: Secondary | ICD-10-CM | POA: Diagnosis not present

## 2019-11-16 DIAGNOSIS — N2581 Secondary hyperparathyroidism of renal origin: Secondary | ICD-10-CM | POA: Diagnosis not present

## 2019-11-16 DIAGNOSIS — E8779 Other fluid overload: Secondary | ICD-10-CM | POA: Diagnosis not present

## 2019-11-19 DIAGNOSIS — N186 End stage renal disease: Secondary | ICD-10-CM | POA: Diagnosis not present

## 2019-11-19 DIAGNOSIS — E8779 Other fluid overload: Secondary | ICD-10-CM | POA: Diagnosis not present

## 2019-11-19 DIAGNOSIS — N2581 Secondary hyperparathyroidism of renal origin: Secondary | ICD-10-CM | POA: Diagnosis not present

## 2019-11-19 DIAGNOSIS — Z23 Encounter for immunization: Secondary | ICD-10-CM | POA: Diagnosis not present

## 2019-11-19 DIAGNOSIS — D631 Anemia in chronic kidney disease: Secondary | ICD-10-CM | POA: Diagnosis not present

## 2019-11-21 DIAGNOSIS — N186 End stage renal disease: Secondary | ICD-10-CM | POA: Diagnosis not present

## 2019-11-21 DIAGNOSIS — Z23 Encounter for immunization: Secondary | ICD-10-CM | POA: Diagnosis not present

## 2019-11-21 DIAGNOSIS — N2581 Secondary hyperparathyroidism of renal origin: Secondary | ICD-10-CM | POA: Diagnosis not present

## 2019-11-21 DIAGNOSIS — D631 Anemia in chronic kidney disease: Secondary | ICD-10-CM | POA: Diagnosis not present

## 2019-11-21 DIAGNOSIS — E8779 Other fluid overload: Secondary | ICD-10-CM | POA: Diagnosis not present

## 2019-11-22 DIAGNOSIS — Z992 Dependence on renal dialysis: Secondary | ICD-10-CM | POA: Diagnosis not present

## 2019-11-22 DIAGNOSIS — E8779 Other fluid overload: Secondary | ICD-10-CM | POA: Diagnosis not present

## 2019-11-22 DIAGNOSIS — N186 End stage renal disease: Secondary | ICD-10-CM | POA: Diagnosis not present

## 2019-11-22 DIAGNOSIS — Z23 Encounter for immunization: Secondary | ICD-10-CM | POA: Diagnosis not present

## 2019-11-22 DIAGNOSIS — D631 Anemia in chronic kidney disease: Secondary | ICD-10-CM | POA: Diagnosis not present

## 2019-11-22 DIAGNOSIS — N2581 Secondary hyperparathyroidism of renal origin: Secondary | ICD-10-CM | POA: Diagnosis not present

## 2019-11-23 DIAGNOSIS — N2581 Secondary hyperparathyroidism of renal origin: Secondary | ICD-10-CM | POA: Diagnosis not present

## 2019-11-23 DIAGNOSIS — D631 Anemia in chronic kidney disease: Secondary | ICD-10-CM | POA: Diagnosis not present

## 2019-11-23 DIAGNOSIS — Z23 Encounter for immunization: Secondary | ICD-10-CM | POA: Diagnosis not present

## 2019-11-23 DIAGNOSIS — N186 End stage renal disease: Secondary | ICD-10-CM | POA: Diagnosis not present

## 2019-11-23 DIAGNOSIS — E8779 Other fluid overload: Secondary | ICD-10-CM | POA: Diagnosis not present

## 2019-11-26 DIAGNOSIS — D631 Anemia in chronic kidney disease: Secondary | ICD-10-CM | POA: Diagnosis not present

## 2019-11-26 DIAGNOSIS — N2581 Secondary hyperparathyroidism of renal origin: Secondary | ICD-10-CM | POA: Diagnosis not present

## 2019-11-26 DIAGNOSIS — N186 End stage renal disease: Secondary | ICD-10-CM | POA: Diagnosis not present

## 2019-11-26 DIAGNOSIS — E8779 Other fluid overload: Secondary | ICD-10-CM | POA: Diagnosis not present

## 2019-11-26 DIAGNOSIS — Z23 Encounter for immunization: Secondary | ICD-10-CM | POA: Diagnosis not present

## 2019-11-28 DIAGNOSIS — D631 Anemia in chronic kidney disease: Secondary | ICD-10-CM | POA: Diagnosis not present

## 2019-11-28 DIAGNOSIS — N186 End stage renal disease: Secondary | ICD-10-CM | POA: Diagnosis not present

## 2019-11-28 DIAGNOSIS — Z23 Encounter for immunization: Secondary | ICD-10-CM | POA: Diagnosis not present

## 2019-11-28 DIAGNOSIS — E8779 Other fluid overload: Secondary | ICD-10-CM | POA: Diagnosis not present

## 2019-11-28 DIAGNOSIS — N2581 Secondary hyperparathyroidism of renal origin: Secondary | ICD-10-CM | POA: Diagnosis not present

## 2019-11-30 DIAGNOSIS — D631 Anemia in chronic kidney disease: Secondary | ICD-10-CM | POA: Diagnosis not present

## 2019-11-30 DIAGNOSIS — N2581 Secondary hyperparathyroidism of renal origin: Secondary | ICD-10-CM | POA: Diagnosis not present

## 2019-11-30 DIAGNOSIS — Z23 Encounter for immunization: Secondary | ICD-10-CM | POA: Diagnosis not present

## 2019-11-30 DIAGNOSIS — N186 End stage renal disease: Secondary | ICD-10-CM | POA: Diagnosis not present

## 2019-11-30 DIAGNOSIS — E8779 Other fluid overload: Secondary | ICD-10-CM | POA: Diagnosis not present

## 2019-12-03 DIAGNOSIS — E8779 Other fluid overload: Secondary | ICD-10-CM | POA: Diagnosis not present

## 2019-12-03 DIAGNOSIS — N2581 Secondary hyperparathyroidism of renal origin: Secondary | ICD-10-CM | POA: Diagnosis not present

## 2019-12-03 DIAGNOSIS — N186 End stage renal disease: Secondary | ICD-10-CM | POA: Diagnosis not present

## 2019-12-03 DIAGNOSIS — D631 Anemia in chronic kidney disease: Secondary | ICD-10-CM | POA: Diagnosis not present

## 2019-12-03 DIAGNOSIS — Z23 Encounter for immunization: Secondary | ICD-10-CM | POA: Diagnosis not present

## 2019-12-05 DIAGNOSIS — N2581 Secondary hyperparathyroidism of renal origin: Secondary | ICD-10-CM | POA: Diagnosis not present

## 2019-12-05 DIAGNOSIS — E8779 Other fluid overload: Secondary | ICD-10-CM | POA: Diagnosis not present

## 2019-12-05 DIAGNOSIS — N186 End stage renal disease: Secondary | ICD-10-CM | POA: Diagnosis not present

## 2019-12-05 DIAGNOSIS — D631 Anemia in chronic kidney disease: Secondary | ICD-10-CM | POA: Diagnosis not present

## 2019-12-05 DIAGNOSIS — Z23 Encounter for immunization: Secondary | ICD-10-CM | POA: Diagnosis not present

## 2019-12-07 DIAGNOSIS — N186 End stage renal disease: Secondary | ICD-10-CM | POA: Diagnosis not present

## 2019-12-07 DIAGNOSIS — N2581 Secondary hyperparathyroidism of renal origin: Secondary | ICD-10-CM | POA: Diagnosis not present

## 2019-12-07 DIAGNOSIS — Z23 Encounter for immunization: Secondary | ICD-10-CM | POA: Diagnosis not present

## 2019-12-07 DIAGNOSIS — E8779 Other fluid overload: Secondary | ICD-10-CM | POA: Diagnosis not present

## 2019-12-07 DIAGNOSIS — D631 Anemia in chronic kidney disease: Secondary | ICD-10-CM | POA: Diagnosis not present

## 2019-12-10 DIAGNOSIS — N186 End stage renal disease: Secondary | ICD-10-CM | POA: Diagnosis not present

## 2019-12-10 DIAGNOSIS — E8779 Other fluid overload: Secondary | ICD-10-CM | POA: Diagnosis not present

## 2019-12-10 DIAGNOSIS — D631 Anemia in chronic kidney disease: Secondary | ICD-10-CM | POA: Diagnosis not present

## 2019-12-10 DIAGNOSIS — N2581 Secondary hyperparathyroidism of renal origin: Secondary | ICD-10-CM | POA: Diagnosis not present

## 2019-12-10 DIAGNOSIS — Z23 Encounter for immunization: Secondary | ICD-10-CM | POA: Diagnosis not present

## 2019-12-11 DIAGNOSIS — Z7901 Long term (current) use of anticoagulants: Secondary | ICD-10-CM | POA: Diagnosis not present

## 2019-12-11 DIAGNOSIS — I509 Heart failure, unspecified: Secondary | ICD-10-CM | POA: Diagnosis not present

## 2019-12-11 DIAGNOSIS — T82510D Breakdown (mechanical) of surgically created arteriovenous fistula, subsequent encounter: Secondary | ICD-10-CM | POA: Diagnosis not present

## 2019-12-11 DIAGNOSIS — I130 Hypertensive heart and chronic kidney disease with heart failure and stage 1 through stage 4 chronic kidney disease, or unspecified chronic kidney disease: Secondary | ICD-10-CM | POA: Diagnosis not present

## 2019-12-11 DIAGNOSIS — N186 End stage renal disease: Secondary | ICD-10-CM | POA: Diagnosis not present

## 2019-12-11 DIAGNOSIS — T82510A Breakdown (mechanical) of surgically created arteriovenous fistula, initial encounter: Secondary | ICD-10-CM | POA: Diagnosis not present

## 2019-12-11 DIAGNOSIS — Z992 Dependence on renal dialysis: Secondary | ICD-10-CM | POA: Diagnosis not present

## 2019-12-12 DIAGNOSIS — N186 End stage renal disease: Secondary | ICD-10-CM | POA: Diagnosis not present

## 2019-12-12 DIAGNOSIS — N2581 Secondary hyperparathyroidism of renal origin: Secondary | ICD-10-CM | POA: Diagnosis not present

## 2019-12-12 DIAGNOSIS — E8779 Other fluid overload: Secondary | ICD-10-CM | POA: Diagnosis not present

## 2019-12-12 DIAGNOSIS — D631 Anemia in chronic kidney disease: Secondary | ICD-10-CM | POA: Diagnosis not present

## 2019-12-12 DIAGNOSIS — Z23 Encounter for immunization: Secondary | ICD-10-CM | POA: Diagnosis not present

## 2019-12-13 DIAGNOSIS — N186 End stage renal disease: Secondary | ICD-10-CM | POA: Diagnosis not present

## 2019-12-13 DIAGNOSIS — D631 Anemia in chronic kidney disease: Secondary | ICD-10-CM | POA: Diagnosis not present

## 2019-12-13 DIAGNOSIS — E8779 Other fluid overload: Secondary | ICD-10-CM | POA: Diagnosis not present

## 2019-12-13 DIAGNOSIS — N2581 Secondary hyperparathyroidism of renal origin: Secondary | ICD-10-CM | POA: Diagnosis not present

## 2019-12-13 DIAGNOSIS — Z23 Encounter for immunization: Secondary | ICD-10-CM | POA: Diagnosis not present

## 2019-12-14 DIAGNOSIS — N2581 Secondary hyperparathyroidism of renal origin: Secondary | ICD-10-CM | POA: Diagnosis not present

## 2019-12-14 DIAGNOSIS — E8779 Other fluid overload: Secondary | ICD-10-CM | POA: Diagnosis not present

## 2019-12-14 DIAGNOSIS — N186 End stage renal disease: Secondary | ICD-10-CM | POA: Diagnosis not present

## 2019-12-14 DIAGNOSIS — D631 Anemia in chronic kidney disease: Secondary | ICD-10-CM | POA: Diagnosis not present

## 2019-12-14 DIAGNOSIS — Z23 Encounter for immunization: Secondary | ICD-10-CM | POA: Diagnosis not present

## 2019-12-17 DIAGNOSIS — E8779 Other fluid overload: Secondary | ICD-10-CM | POA: Diagnosis not present

## 2019-12-17 DIAGNOSIS — N186 End stage renal disease: Secondary | ICD-10-CM | POA: Diagnosis not present

## 2019-12-17 DIAGNOSIS — Z23 Encounter for immunization: Secondary | ICD-10-CM | POA: Diagnosis not present

## 2019-12-17 DIAGNOSIS — D631 Anemia in chronic kidney disease: Secondary | ICD-10-CM | POA: Diagnosis not present

## 2019-12-17 DIAGNOSIS — N2581 Secondary hyperparathyroidism of renal origin: Secondary | ICD-10-CM | POA: Diagnosis not present

## 2019-12-19 DIAGNOSIS — N2581 Secondary hyperparathyroidism of renal origin: Secondary | ICD-10-CM | POA: Diagnosis not present

## 2019-12-19 DIAGNOSIS — N186 End stage renal disease: Secondary | ICD-10-CM | POA: Diagnosis not present

## 2019-12-19 DIAGNOSIS — D631 Anemia in chronic kidney disease: Secondary | ICD-10-CM | POA: Diagnosis not present

## 2019-12-19 DIAGNOSIS — E8779 Other fluid overload: Secondary | ICD-10-CM | POA: Diagnosis not present

## 2019-12-19 DIAGNOSIS — Z23 Encounter for immunization: Secondary | ICD-10-CM | POA: Diagnosis not present

## 2019-12-21 DIAGNOSIS — Z23 Encounter for immunization: Secondary | ICD-10-CM | POA: Diagnosis not present

## 2019-12-21 DIAGNOSIS — E8779 Other fluid overload: Secondary | ICD-10-CM | POA: Diagnosis not present

## 2019-12-21 DIAGNOSIS — D631 Anemia in chronic kidney disease: Secondary | ICD-10-CM | POA: Diagnosis not present

## 2019-12-21 DIAGNOSIS — N186 End stage renal disease: Secondary | ICD-10-CM | POA: Diagnosis not present

## 2019-12-21 DIAGNOSIS — N2581 Secondary hyperparathyroidism of renal origin: Secondary | ICD-10-CM | POA: Diagnosis not present

## 2019-12-23 DIAGNOSIS — N186 End stage renal disease: Secondary | ICD-10-CM | POA: Diagnosis not present

## 2019-12-23 DIAGNOSIS — Z992 Dependence on renal dialysis: Secondary | ICD-10-CM | POA: Diagnosis not present

## 2019-12-24 DIAGNOSIS — E8779 Other fluid overload: Secondary | ICD-10-CM | POA: Diagnosis not present

## 2019-12-24 DIAGNOSIS — N2581 Secondary hyperparathyroidism of renal origin: Secondary | ICD-10-CM | POA: Diagnosis not present

## 2019-12-24 DIAGNOSIS — D631 Anemia in chronic kidney disease: Secondary | ICD-10-CM | POA: Diagnosis not present

## 2019-12-24 DIAGNOSIS — N186 End stage renal disease: Secondary | ICD-10-CM | POA: Diagnosis not present

## 2019-12-26 DIAGNOSIS — N186 End stage renal disease: Secondary | ICD-10-CM | POA: Diagnosis not present

## 2019-12-26 DIAGNOSIS — E8779 Other fluid overload: Secondary | ICD-10-CM | POA: Diagnosis not present

## 2019-12-26 DIAGNOSIS — D631 Anemia in chronic kidney disease: Secondary | ICD-10-CM | POA: Diagnosis not present

## 2019-12-26 DIAGNOSIS — N2581 Secondary hyperparathyroidism of renal origin: Secondary | ICD-10-CM | POA: Diagnosis not present

## 2019-12-28 DIAGNOSIS — N186 End stage renal disease: Secondary | ICD-10-CM | POA: Diagnosis not present

## 2019-12-28 DIAGNOSIS — E8779 Other fluid overload: Secondary | ICD-10-CM | POA: Diagnosis not present

## 2019-12-28 DIAGNOSIS — D631 Anemia in chronic kidney disease: Secondary | ICD-10-CM | POA: Diagnosis not present

## 2019-12-28 DIAGNOSIS — N2581 Secondary hyperparathyroidism of renal origin: Secondary | ICD-10-CM | POA: Diagnosis not present

## 2019-12-29 DIAGNOSIS — D631 Anemia in chronic kidney disease: Secondary | ICD-10-CM | POA: Diagnosis not present

## 2019-12-29 DIAGNOSIS — E8779 Other fluid overload: Secondary | ICD-10-CM | POA: Diagnosis not present

## 2019-12-29 DIAGNOSIS — N186 End stage renal disease: Secondary | ICD-10-CM | POA: Diagnosis not present

## 2019-12-29 DIAGNOSIS — N2581 Secondary hyperparathyroidism of renal origin: Secondary | ICD-10-CM | POA: Diagnosis not present

## 2019-12-31 DIAGNOSIS — N186 End stage renal disease: Secondary | ICD-10-CM | POA: Diagnosis not present

## 2019-12-31 DIAGNOSIS — N2581 Secondary hyperparathyroidism of renal origin: Secondary | ICD-10-CM | POA: Diagnosis not present

## 2019-12-31 DIAGNOSIS — E8779 Other fluid overload: Secondary | ICD-10-CM | POA: Diagnosis not present

## 2019-12-31 DIAGNOSIS — D631 Anemia in chronic kidney disease: Secondary | ICD-10-CM | POA: Diagnosis not present

## 2020-01-02 DIAGNOSIS — E8779 Other fluid overload: Secondary | ICD-10-CM | POA: Diagnosis not present

## 2020-01-02 DIAGNOSIS — N2581 Secondary hyperparathyroidism of renal origin: Secondary | ICD-10-CM | POA: Diagnosis not present

## 2020-01-02 DIAGNOSIS — D631 Anemia in chronic kidney disease: Secondary | ICD-10-CM | POA: Diagnosis not present

## 2020-01-02 DIAGNOSIS — N186 End stage renal disease: Secondary | ICD-10-CM | POA: Diagnosis not present

## 2020-01-04 DIAGNOSIS — E8779 Other fluid overload: Secondary | ICD-10-CM | POA: Diagnosis not present

## 2020-01-04 DIAGNOSIS — N2581 Secondary hyperparathyroidism of renal origin: Secondary | ICD-10-CM | POA: Diagnosis not present

## 2020-01-04 DIAGNOSIS — D631 Anemia in chronic kidney disease: Secondary | ICD-10-CM | POA: Diagnosis not present

## 2020-01-04 DIAGNOSIS — N186 End stage renal disease: Secondary | ICD-10-CM | POA: Diagnosis not present

## 2020-01-07 DIAGNOSIS — D631 Anemia in chronic kidney disease: Secondary | ICD-10-CM | POA: Diagnosis not present

## 2020-01-07 DIAGNOSIS — N2581 Secondary hyperparathyroidism of renal origin: Secondary | ICD-10-CM | POA: Diagnosis not present

## 2020-01-07 DIAGNOSIS — N186 End stage renal disease: Secondary | ICD-10-CM | POA: Diagnosis not present

## 2020-01-07 DIAGNOSIS — E8779 Other fluid overload: Secondary | ICD-10-CM | POA: Diagnosis not present

## 2020-01-09 DIAGNOSIS — N2581 Secondary hyperparathyroidism of renal origin: Secondary | ICD-10-CM | POA: Diagnosis not present

## 2020-01-09 DIAGNOSIS — D631 Anemia in chronic kidney disease: Secondary | ICD-10-CM | POA: Diagnosis not present

## 2020-01-09 DIAGNOSIS — E8779 Other fluid overload: Secondary | ICD-10-CM | POA: Diagnosis not present

## 2020-01-09 DIAGNOSIS — N186 End stage renal disease: Secondary | ICD-10-CM | POA: Diagnosis not present

## 2020-01-11 DIAGNOSIS — N186 End stage renal disease: Secondary | ICD-10-CM | POA: Diagnosis not present

## 2020-01-11 DIAGNOSIS — E8779 Other fluid overload: Secondary | ICD-10-CM | POA: Diagnosis not present

## 2020-01-11 DIAGNOSIS — D631 Anemia in chronic kidney disease: Secondary | ICD-10-CM | POA: Diagnosis not present

## 2020-01-11 DIAGNOSIS — N2581 Secondary hyperparathyroidism of renal origin: Secondary | ICD-10-CM | POA: Diagnosis not present

## 2020-01-14 DIAGNOSIS — N2581 Secondary hyperparathyroidism of renal origin: Secondary | ICD-10-CM | POA: Diagnosis not present

## 2020-01-14 DIAGNOSIS — E8779 Other fluid overload: Secondary | ICD-10-CM | POA: Diagnosis not present

## 2020-01-14 DIAGNOSIS — N186 End stage renal disease: Secondary | ICD-10-CM | POA: Diagnosis not present

## 2020-01-14 DIAGNOSIS — D631 Anemia in chronic kidney disease: Secondary | ICD-10-CM | POA: Diagnosis not present

## 2020-01-15 DIAGNOSIS — N186 End stage renal disease: Secondary | ICD-10-CM | POA: Diagnosis not present

## 2020-01-15 DIAGNOSIS — E8779 Other fluid overload: Secondary | ICD-10-CM | POA: Diagnosis not present

## 2020-01-15 DIAGNOSIS — D631 Anemia in chronic kidney disease: Secondary | ICD-10-CM | POA: Diagnosis not present

## 2020-01-15 DIAGNOSIS — N2581 Secondary hyperparathyroidism of renal origin: Secondary | ICD-10-CM | POA: Diagnosis not present

## 2020-01-18 DIAGNOSIS — N2581 Secondary hyperparathyroidism of renal origin: Secondary | ICD-10-CM | POA: Diagnosis not present

## 2020-01-18 DIAGNOSIS — D631 Anemia in chronic kidney disease: Secondary | ICD-10-CM | POA: Diagnosis not present

## 2020-01-18 DIAGNOSIS — N186 End stage renal disease: Secondary | ICD-10-CM | POA: Diagnosis not present

## 2020-01-18 DIAGNOSIS — E8779 Other fluid overload: Secondary | ICD-10-CM | POA: Diagnosis not present

## 2020-01-19 DIAGNOSIS — D631 Anemia in chronic kidney disease: Secondary | ICD-10-CM | POA: Diagnosis not present

## 2020-01-19 DIAGNOSIS — E8779 Other fluid overload: Secondary | ICD-10-CM | POA: Diagnosis not present

## 2020-01-19 DIAGNOSIS — N186 End stage renal disease: Secondary | ICD-10-CM | POA: Diagnosis not present

## 2020-01-19 DIAGNOSIS — N2581 Secondary hyperparathyroidism of renal origin: Secondary | ICD-10-CM | POA: Diagnosis not present

## 2020-01-21 DIAGNOSIS — N2581 Secondary hyperparathyroidism of renal origin: Secondary | ICD-10-CM | POA: Diagnosis not present

## 2020-01-21 DIAGNOSIS — E8779 Other fluid overload: Secondary | ICD-10-CM | POA: Diagnosis not present

## 2020-01-21 DIAGNOSIS — D631 Anemia in chronic kidney disease: Secondary | ICD-10-CM | POA: Diagnosis not present

## 2020-01-21 DIAGNOSIS — N186 End stage renal disease: Secondary | ICD-10-CM | POA: Diagnosis not present

## 2020-01-22 DIAGNOSIS — Z992 Dependence on renal dialysis: Secondary | ICD-10-CM | POA: Diagnosis not present

## 2020-01-22 DIAGNOSIS — N186 End stage renal disease: Secondary | ICD-10-CM | POA: Diagnosis not present

## 2020-01-23 DIAGNOSIS — N2581 Secondary hyperparathyroidism of renal origin: Secondary | ICD-10-CM | POA: Diagnosis not present

## 2020-01-23 DIAGNOSIS — N186 End stage renal disease: Secondary | ICD-10-CM | POA: Diagnosis not present

## 2020-01-23 DIAGNOSIS — D631 Anemia in chronic kidney disease: Secondary | ICD-10-CM | POA: Diagnosis not present

## 2020-01-23 DIAGNOSIS — E8779 Other fluid overload: Secondary | ICD-10-CM | POA: Diagnosis not present

## 2020-01-25 DIAGNOSIS — N2581 Secondary hyperparathyroidism of renal origin: Secondary | ICD-10-CM | POA: Diagnosis not present

## 2020-01-25 DIAGNOSIS — E8779 Other fluid overload: Secondary | ICD-10-CM | POA: Diagnosis not present

## 2020-01-25 DIAGNOSIS — N186 End stage renal disease: Secondary | ICD-10-CM | POA: Diagnosis not present

## 2020-01-25 DIAGNOSIS — D631 Anemia in chronic kidney disease: Secondary | ICD-10-CM | POA: Diagnosis not present

## 2020-01-28 DIAGNOSIS — N186 End stage renal disease: Secondary | ICD-10-CM | POA: Diagnosis not present

## 2020-01-28 DIAGNOSIS — D631 Anemia in chronic kidney disease: Secondary | ICD-10-CM | POA: Diagnosis not present

## 2020-01-28 DIAGNOSIS — E8779 Other fluid overload: Secondary | ICD-10-CM | POA: Diagnosis not present

## 2020-01-28 DIAGNOSIS — N2581 Secondary hyperparathyroidism of renal origin: Secondary | ICD-10-CM | POA: Diagnosis not present

## 2020-01-29 DIAGNOSIS — E8779 Other fluid overload: Secondary | ICD-10-CM | POA: Diagnosis not present

## 2020-01-29 DIAGNOSIS — D631 Anemia in chronic kidney disease: Secondary | ICD-10-CM | POA: Diagnosis not present

## 2020-01-29 DIAGNOSIS — N186 End stage renal disease: Secondary | ICD-10-CM | POA: Diagnosis not present

## 2020-01-29 DIAGNOSIS — N2581 Secondary hyperparathyroidism of renal origin: Secondary | ICD-10-CM | POA: Diagnosis not present

## 2020-01-30 DIAGNOSIS — N186 End stage renal disease: Secondary | ICD-10-CM | POA: Diagnosis not present

## 2020-01-30 DIAGNOSIS — D631 Anemia in chronic kidney disease: Secondary | ICD-10-CM | POA: Diagnosis not present

## 2020-01-30 DIAGNOSIS — E8779 Other fluid overload: Secondary | ICD-10-CM | POA: Diagnosis not present

## 2020-01-30 DIAGNOSIS — N2581 Secondary hyperparathyroidism of renal origin: Secondary | ICD-10-CM | POA: Diagnosis not present

## 2020-02-01 DIAGNOSIS — N2581 Secondary hyperparathyroidism of renal origin: Secondary | ICD-10-CM | POA: Diagnosis not present

## 2020-02-01 DIAGNOSIS — N186 End stage renal disease: Secondary | ICD-10-CM | POA: Diagnosis not present

## 2020-02-01 DIAGNOSIS — D631 Anemia in chronic kidney disease: Secondary | ICD-10-CM | POA: Diagnosis not present

## 2020-02-01 DIAGNOSIS — E8779 Other fluid overload: Secondary | ICD-10-CM | POA: Diagnosis not present

## 2020-02-04 DIAGNOSIS — E8779 Other fluid overload: Secondary | ICD-10-CM | POA: Diagnosis not present

## 2020-02-04 DIAGNOSIS — D631 Anemia in chronic kidney disease: Secondary | ICD-10-CM | POA: Diagnosis not present

## 2020-02-04 DIAGNOSIS — N2581 Secondary hyperparathyroidism of renal origin: Secondary | ICD-10-CM | POA: Diagnosis not present

## 2020-02-04 DIAGNOSIS — N186 End stage renal disease: Secondary | ICD-10-CM | POA: Diagnosis not present

## 2020-02-06 DIAGNOSIS — E8779 Other fluid overload: Secondary | ICD-10-CM | POA: Diagnosis not present

## 2020-02-06 DIAGNOSIS — N186 End stage renal disease: Secondary | ICD-10-CM | POA: Diagnosis not present

## 2020-02-06 DIAGNOSIS — D631 Anemia in chronic kidney disease: Secondary | ICD-10-CM | POA: Diagnosis not present

## 2020-02-06 DIAGNOSIS — N2581 Secondary hyperparathyroidism of renal origin: Secondary | ICD-10-CM | POA: Diagnosis not present

## 2020-02-08 DIAGNOSIS — D631 Anemia in chronic kidney disease: Secondary | ICD-10-CM | POA: Diagnosis not present

## 2020-02-08 DIAGNOSIS — N186 End stage renal disease: Secondary | ICD-10-CM | POA: Diagnosis not present

## 2020-02-08 DIAGNOSIS — E8779 Other fluid overload: Secondary | ICD-10-CM | POA: Diagnosis not present

## 2020-02-08 DIAGNOSIS — N2581 Secondary hyperparathyroidism of renal origin: Secondary | ICD-10-CM | POA: Diagnosis not present

## 2020-02-11 DIAGNOSIS — D631 Anemia in chronic kidney disease: Secondary | ICD-10-CM | POA: Diagnosis not present

## 2020-02-11 DIAGNOSIS — E8779 Other fluid overload: Secondary | ICD-10-CM | POA: Diagnosis not present

## 2020-02-11 DIAGNOSIS — N2581 Secondary hyperparathyroidism of renal origin: Secondary | ICD-10-CM | POA: Diagnosis not present

## 2020-02-11 DIAGNOSIS — N186 End stage renal disease: Secondary | ICD-10-CM | POA: Diagnosis not present

## 2020-02-12 DIAGNOSIS — D631 Anemia in chronic kidney disease: Secondary | ICD-10-CM | POA: Diagnosis not present

## 2020-02-12 DIAGNOSIS — E8779 Other fluid overload: Secondary | ICD-10-CM | POA: Diagnosis not present

## 2020-02-12 DIAGNOSIS — N186 End stage renal disease: Secondary | ICD-10-CM | POA: Diagnosis not present

## 2020-02-12 DIAGNOSIS — N2581 Secondary hyperparathyroidism of renal origin: Secondary | ICD-10-CM | POA: Diagnosis not present

## 2020-02-13 DIAGNOSIS — D631 Anemia in chronic kidney disease: Secondary | ICD-10-CM | POA: Diagnosis not present

## 2020-02-13 DIAGNOSIS — N2581 Secondary hyperparathyroidism of renal origin: Secondary | ICD-10-CM | POA: Diagnosis not present

## 2020-02-13 DIAGNOSIS — N186 End stage renal disease: Secondary | ICD-10-CM | POA: Diagnosis not present

## 2020-02-13 DIAGNOSIS — E8779 Other fluid overload: Secondary | ICD-10-CM | POA: Diagnosis not present

## 2020-02-13 DIAGNOSIS — Z1159 Encounter for screening for other viral diseases: Secondary | ICD-10-CM | POA: Diagnosis not present

## 2020-02-15 DIAGNOSIS — N2581 Secondary hyperparathyroidism of renal origin: Secondary | ICD-10-CM | POA: Diagnosis not present

## 2020-02-15 DIAGNOSIS — D631 Anemia in chronic kidney disease: Secondary | ICD-10-CM | POA: Diagnosis not present

## 2020-02-15 DIAGNOSIS — E8779 Other fluid overload: Secondary | ICD-10-CM | POA: Diagnosis not present

## 2020-02-15 DIAGNOSIS — N186 End stage renal disease: Secondary | ICD-10-CM | POA: Diagnosis not present

## 2020-02-18 DIAGNOSIS — N2581 Secondary hyperparathyroidism of renal origin: Secondary | ICD-10-CM | POA: Diagnosis not present

## 2020-02-18 DIAGNOSIS — D631 Anemia in chronic kidney disease: Secondary | ICD-10-CM | POA: Diagnosis not present

## 2020-02-18 DIAGNOSIS — N186 End stage renal disease: Secondary | ICD-10-CM | POA: Diagnosis not present

## 2020-02-18 DIAGNOSIS — E8779 Other fluid overload: Secondary | ICD-10-CM | POA: Diagnosis not present

## 2020-02-20 DIAGNOSIS — N186 End stage renal disease: Secondary | ICD-10-CM | POA: Diagnosis not present

## 2020-02-20 DIAGNOSIS — D631 Anemia in chronic kidney disease: Secondary | ICD-10-CM | POA: Diagnosis not present

## 2020-02-20 DIAGNOSIS — E8779 Other fluid overload: Secondary | ICD-10-CM | POA: Diagnosis not present

## 2020-02-20 DIAGNOSIS — N2581 Secondary hyperparathyroidism of renal origin: Secondary | ICD-10-CM | POA: Diagnosis not present

## 2020-02-22 DIAGNOSIS — D631 Anemia in chronic kidney disease: Secondary | ICD-10-CM | POA: Diagnosis not present

## 2020-02-22 DIAGNOSIS — N186 End stage renal disease: Secondary | ICD-10-CM | POA: Diagnosis not present

## 2020-02-22 DIAGNOSIS — E8779 Other fluid overload: Secondary | ICD-10-CM | POA: Diagnosis not present

## 2020-02-22 DIAGNOSIS — N2581 Secondary hyperparathyroidism of renal origin: Secondary | ICD-10-CM | POA: Diagnosis not present

## 2020-02-22 DIAGNOSIS — Z992 Dependence on renal dialysis: Secondary | ICD-10-CM | POA: Diagnosis not present
